# Patient Record
Sex: Female | Born: 1985
Health system: Southern US, Community
[De-identification: ages and names within clinical notes are randomized; demographics above are authoritative.]

## PROBLEM LIST (undated history)

## (undated) DIAGNOSIS — F419 Anxiety disorder, unspecified: Secondary | ICD-10-CM

## (undated) DIAGNOSIS — M419 Scoliosis, unspecified: Secondary | ICD-10-CM

## (undated) DIAGNOSIS — R55 Syncope and collapse: Secondary | ICD-10-CM

## (undated) DIAGNOSIS — F32A Depression, unspecified: Secondary | ICD-10-CM

## (undated) DIAGNOSIS — J449 Chronic obstructive pulmonary disease, unspecified: Secondary | ICD-10-CM

## (undated) DIAGNOSIS — F410 Panic disorder [episodic paroxysmal anxiety] without agoraphobia: Secondary | ICD-10-CM

## (undated) DIAGNOSIS — F329 Major depressive disorder, single episode, unspecified: Secondary | ICD-10-CM

## (undated) DIAGNOSIS — R51 Headache: Secondary | ICD-10-CM

## (undated) DIAGNOSIS — M1990 Unspecified osteoarthritis, unspecified site: Secondary | ICD-10-CM

## (undated) DIAGNOSIS — F319 Bipolar disorder, unspecified: Secondary | ICD-10-CM

## (undated) HISTORY — PX: TUBAL LIGATION: SHX77

## (undated) HISTORY — DX: Syncope and collapse: R55

## (undated) HISTORY — PX: FRACTURE SURGERY: SHX138

## (undated) HISTORY — DX: Headache: R51

---
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
## 2010-01-18 ENCOUNTER — Emergency Department (HOSPITAL_COMMUNITY): Admission: EM | Admit: 2010-01-18 | Discharge: 2010-01-19 | Payer: Self-pay | Admitting: Emergency Medicine

## 2010-06-08 LAB — URINALYSIS, ROUTINE W REFLEX MICROSCOPIC
Bilirubin Urine: NEGATIVE
Glucose, UA: NEGATIVE mg/dL
Hgb urine dipstick: NEGATIVE
Ketones, ur: NEGATIVE mg/dL
Nitrite: NEGATIVE
Protein, ur: NEGATIVE mg/dL
Specific Gravity, Urine: 1.008 (ref 1.005–1.030)
Urobilinogen, UA: 1 mg/dL (ref 0.0–1.0)
pH: 6.5 (ref 5.0–8.0)

## 2010-06-08 LAB — GC/CHLAMYDIA PROBE AMP, GENITAL
Chlamydia, DNA Probe: NEGATIVE
GC Probe Amp, Genital: NEGATIVE

## 2010-06-08 LAB — WET PREP, GENITAL
Clue Cells Wet Prep HPF POC: NONE SEEN
Trich, Wet Prep: NONE SEEN

## 2010-06-08 LAB — POCT PREGNANCY, URINE: Preg Test, Ur: NEGATIVE

## 2010-06-17 ENCOUNTER — Emergency Department (HOSPITAL_COMMUNITY): Payer: Self-pay

## 2010-06-17 ENCOUNTER — Emergency Department (HOSPITAL_COMMUNITY)
Admission: EM | Admit: 2010-06-17 | Discharge: 2010-06-17 | Disposition: A | Discharge status: Home / Self Care | Payer: Self-pay | Attending: Emergency Medicine | Admitting: Emergency Medicine

## 2010-06-17 DIAGNOSIS — M129 Arthropathy, unspecified: Secondary | ICD-10-CM | POA: Insufficient documentation

## 2010-06-17 DIAGNOSIS — R109 Unspecified abdominal pain: Secondary | ICD-10-CM | POA: Insufficient documentation

## 2010-06-17 DIAGNOSIS — R110 Nausea: Secondary | ICD-10-CM | POA: Insufficient documentation

## 2010-06-17 DIAGNOSIS — R072 Precordial pain: Secondary | ICD-10-CM | POA: Insufficient documentation

## 2010-06-17 LAB — DIFFERENTIAL
Eosinophils Absolute: 0.2 10*3/uL (ref 0.0–0.7)
Lymphocytes Relative: 34 % (ref 12–46)
Lymphs Abs: 3 10*3/uL (ref 0.7–4.0)
Neutro Abs: 5 10*3/uL (ref 1.7–7.7)
Neutrophils Relative %: 57 % (ref 43–77)

## 2010-06-17 LAB — URINE MICROSCOPIC-ADD ON

## 2010-06-17 LAB — URINALYSIS, ROUTINE W REFLEX MICROSCOPIC
Bilirubin Urine: NEGATIVE
Glucose, UA: NEGATIVE mg/dL
Hgb urine dipstick: NEGATIVE
Ketones, ur: NEGATIVE mg/dL
Protein, ur: NEGATIVE mg/dL
pH: 7.5 (ref 5.0–8.0)

## 2010-06-17 LAB — WET PREP, GENITAL: Yeast Wet Prep HPF POC: NONE SEEN

## 2010-06-17 LAB — COMPREHENSIVE METABOLIC PANEL
AST: 20 U/L (ref 0–37)
Albumin: 4.6 g/dL (ref 3.5–5.2)
Alkaline Phosphatase: 70 U/L (ref 39–117)
BUN: 8 mg/dL (ref 6–23)
CO2: 26 mEq/L (ref 19–32)
Chloride: 108 mEq/L (ref 96–112)
GFR calc non Af Amer: >60 mL/min (ref >60)
Potassium: 3.6 mEq/L (ref 3.5–5.1)
Total Bilirubin: 0.4 mg/dL (ref 0.3–1.2)

## 2010-06-17 LAB — POCT PREGNANCY, URINE: Preg Test, Ur: NEGATIVE

## 2010-06-17 LAB — CBC
Hemoglobin: 17.2 g/dL — ABNORMAL HIGH (ref 12.0–15.0)
MCV: 90.2 fL (ref 78.0–100.0)
Platelets: 269 10*3/uL (ref 150–400)
RBC: 5.33 MIL/uL — ABNORMAL HIGH (ref 3.87–5.11)
WBC: 8.7 10*3/uL (ref 4.0–10.5)

## 2010-06-18 LAB — GC/CHLAMYDIA PROBE AMP, GENITAL: GC Probe Amp, Genital: NEGATIVE
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
## 2010-08-20 ENCOUNTER — Emergency Department (HOSPITAL_COMMUNITY)
Admission: EM | Admit: 2010-08-20 | Discharge: 2010-08-20 | Disposition: A | Discharge status: Home / Self Care | Payer: Self-pay | Attending: Emergency Medicine | Admitting: Emergency Medicine

## 2010-08-20 DIAGNOSIS — Y92009 Unspecified place in unspecified non-institutional (private) residence as the place of occurrence of the external cause: Secondary | ICD-10-CM | POA: Insufficient documentation

## 2010-08-20 DIAGNOSIS — W260XXA Contact with knife, initial encounter: Secondary | ICD-10-CM | POA: Insufficient documentation

## 2010-08-20 DIAGNOSIS — S51809A Unspecified open wound of unspecified forearm, initial encounter: Secondary | ICD-10-CM | POA: Insufficient documentation

## 2010-08-20 DIAGNOSIS — Y93G3 Activity, cooking and baking: Secondary | ICD-10-CM | POA: Insufficient documentation

## 2010-08-30 ENCOUNTER — Inpatient Hospital Stay (INDEPENDENT_AMBULATORY_CARE_PROVIDER_SITE_OTHER)
Admission: RE | Admit: 2010-08-30 | Discharge: 2010-08-30 | Disposition: A | Discharge status: Home / Self Care | Payer: Self-pay | Source: Ambulatory Visit | Attending: Emergency Medicine | Admitting: Emergency Medicine

## 2010-08-30 DIAGNOSIS — Z0489 Encounter for examination and observation for other specified reasons: Secondary | ICD-10-CM

## 2011-02-12 ENCOUNTER — Emergency Department (HOSPITAL_COMMUNITY)
Admission: EM | Admit: 2011-02-12 | Discharge: 2011-02-13 | Disposition: A | Discharge status: Home / Self Care | Payer: Medicare Other | Attending: Emergency Medicine | Admitting: Emergency Medicine

## 2011-02-12 ENCOUNTER — Encounter: Payer: Self-pay | Admitting: *Deleted

## 2011-02-12 DIAGNOSIS — R42 Dizziness and giddiness: Secondary | ICD-10-CM | POA: Insufficient documentation

## 2011-02-12 DIAGNOSIS — N644 Mastodynia: Secondary | ICD-10-CM | POA: Insufficient documentation

## 2011-02-12 DIAGNOSIS — K5900 Constipation, unspecified: Secondary | ICD-10-CM | POA: Insufficient documentation

## 2011-02-12 DIAGNOSIS — R1031 Right lower quadrant pain: Secondary | ICD-10-CM | POA: Insufficient documentation

## 2011-02-12 DIAGNOSIS — R350 Frequency of micturition: Secondary | ICD-10-CM | POA: Insufficient documentation

## 2011-02-12 DIAGNOSIS — I498 Other specified cardiac arrhythmias: Secondary | ICD-10-CM | POA: Insufficient documentation

## 2011-02-12 DIAGNOSIS — E860 Dehydration: Secondary | ICD-10-CM

## 2011-02-12 HISTORY — DX: Anxiety disorder, unspecified: F41.9

## 2011-02-12 HISTORY — DX: Scoliosis, unspecified: M41.9

## 2011-02-12 HISTORY — DX: Panic disorder (episodic paroxysmal anxiety): F41.0

## 2011-02-12 HISTORY — DX: Unspecified osteoarthritis, unspecified site: M19.90

## 2011-02-12 NOTE — ED Notes (Addendum)
C/o dizziness, constipation, off balance, low R abd/pelvic pain, breasts hurting, increased urination, nausea. Dizziness on this am. C/c is dizziness. Screened for SI, denies SI/HI, but "may want to be seen for [facility] issues", lists h/o ETOH use to cope & h/o anti-depressants (distant), hopeful & hopelessness expressed, major life issues & stress reported.

## 2011-02-13 ENCOUNTER — Other Ambulatory Visit: Payer: Self-pay

## 2011-02-13 LAB — URINE MICROSCOPIC-ADD ON

## 2011-02-13 LAB — BASIC METABOLIC PANEL
CO2: 20 mEq/L (ref 19–32)
Chloride: 102 mEq/L (ref 96–112)
GFR calc non Af Amer: >90 mL/min (ref >90)
Glucose, Bld: 91 mg/dL (ref 70–99)
Potassium: 3.9 mEq/L (ref 3.5–5.1)
Sodium: 138 mEq/L (ref 135–145)

## 2011-02-13 LAB — PREGNANCY, URINE: Preg Test, Ur: NEGATIVE

## 2011-02-13 LAB — DIFFERENTIAL
Lymphocytes Relative: 23 % (ref 12–46)
Lymphs Abs: 2.4 10*3/uL (ref 0.7–4.0)
Neutrophils Relative %: 71 % (ref 43–77)

## 2011-02-13 LAB — CBC
Platelets: 253 10*3/uL (ref 150–400)
RBC: 5.42 MIL/uL — ABNORMAL HIGH (ref 3.87–5.11)
WBC: 10.6 10*3/uL — ABNORMAL HIGH (ref 4.0–10.5)

## 2011-02-13 LAB — URINALYSIS, ROUTINE W REFLEX MICROSCOPIC
Glucose, UA: NEGATIVE mg/dL
Hgb urine dipstick: NEGATIVE
Specific Gravity, Urine: 1.004 — ABNORMAL LOW (ref 1.005–1.030)
Urobilinogen, UA: 0.2 mg/dL (ref 0.0–1.0)

## 2011-02-13 MED ORDER — SODIUM CHLORIDE 0.9 % IV BOLUS (SEPSIS)
1000.0000 mL | Freq: Once | INTRAVENOUS | Status: AC
  Administered 2011-02-13: 1000 mL via INTRAVENOUS

## 2011-02-13 MED ORDER — MECLIZINE HCL 25 MG PO TABS
25.0000 mg | ORAL_TABLET | Freq: Once | ORAL | Status: AC
  Administered 2011-02-13: 25 mg via ORAL
  Filled 2011-02-13: qty 1

## 2011-02-13 MED ORDER — MECLIZINE HCL 50 MG PO TABS: 50.0000 mg | ORAL_TABLET | Freq: Three times a day (TID) | ORAL | Status: AC | PRN

## 2011-02-13 MED ORDER — DOCUSATE SODIUM 100 MG PO CAPS: 100.0000 mg | ORAL_CAPSULE | Freq: Two times a day (BID) | ORAL | Status: AC

## 2011-02-13 NOTE — ED Provider Notes (Signed)
Medical screening examination/treatment/procedure(s) were performed by non-physician practitioner and as supervising physician I was immediately available for consultation/collaboration.   Date: 02/13/2011  Rate: 109  Rhythm: sinus tachycardia  QRS Axis: normal  Intervals: normal  ST/T Wave abnormalities: nonspecific T wave changes  Conduction Disutrbances:none  Narrative Interpretation:   Old EKG Reviewed: none available    Celene Kras, MD 02/13/11 [phone]

## 2011-02-13 NOTE — ED Notes (Signed)
Pt c/o dizziness with movement. Pt also c/o increased urination. Pt denies pain at this time. No distress noted. Pt moving all extremities. No neuro deficits noted.

## 2011-02-13 NOTE — ED Notes (Signed)
Pt resting quietly in bed. No distress noted. IVF continue to infuse.

## 2011-02-14 LAB — URINE CULTURE: Colony Count: 9000

## 2011-09-04 ENCOUNTER — Encounter (HOSPITAL_COMMUNITY): Payer: Self-pay | Admitting: *Deleted

## 2011-09-04 ENCOUNTER — Emergency Department (HOSPITAL_COMMUNITY)
Admission: EM | Admit: 2011-09-04 | Discharge: 2011-09-05 | Disposition: A | Discharge status: Home / Self Care | Payer: Medicare Other | Attending: Emergency Medicine | Admitting: Emergency Medicine

## 2011-09-04 DIAGNOSIS — F411 Generalized anxiety disorder: Secondary | ICD-10-CM | POA: Insufficient documentation

## 2011-09-04 DIAGNOSIS — F17200 Nicotine dependence, unspecified, uncomplicated: Secondary | ICD-10-CM | POA: Diagnosis not present

## 2011-09-04 DIAGNOSIS — Z8739 Personal history of other diseases of the musculoskeletal system and connective tissue: Secondary | ICD-10-CM | POA: Diagnosis not present

## 2011-09-04 DIAGNOSIS — IMO0002 Reserved for concepts with insufficient information to code with codable children: Secondary | ICD-10-CM | POA: Diagnosis not present

## 2011-09-04 DIAGNOSIS — T7421XA Adult sexual abuse, confirmed, initial encounter: Secondary | ICD-10-CM | POA: Diagnosis not present
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
## 2011-09-05 NOTE — ED Provider Notes (Signed)
Medical screening examination/treatment/procedure(s) were performed by non-physician practitioner and as supervising physician I was immediately available for consultation/collaboration.   Tamira Ryland B. Bernette Mayers, MD 09/05/11 4098
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
## 2012-05-16 ENCOUNTER — Emergency Department (HOSPITAL_COMMUNITY)
Admission: EM | Admit: 2012-05-16 | Discharge: 2012-05-16 | Disposition: A | Discharge status: Home / Self Care | Payer: Medicare Other | Attending: Emergency Medicine | Admitting: Emergency Medicine

## 2012-05-16 ENCOUNTER — Encounter (HOSPITAL_COMMUNITY): Payer: Self-pay

## 2012-05-16 DIAGNOSIS — Z79899 Other long term (current) drug therapy: Secondary | ICD-10-CM | POA: Insufficient documentation

## 2012-05-16 DIAGNOSIS — Z8739 Personal history of other diseases of the musculoskeletal system and connective tissue: Secondary | ICD-10-CM | POA: Insufficient documentation

## 2012-05-16 DIAGNOSIS — K089 Disorder of teeth and supporting structures, unspecified: Secondary | ICD-10-CM | POA: Insufficient documentation

## 2012-05-16 DIAGNOSIS — H9209 Otalgia, unspecified ear: Secondary | ICD-10-CM | POA: Insufficient documentation

## 2012-05-16 DIAGNOSIS — K029 Dental caries, unspecified: Secondary | ICD-10-CM | POA: Insufficient documentation

## 2012-05-16 DIAGNOSIS — Z8659 Personal history of other mental and behavioral disorders: Secondary | ICD-10-CM | POA: Diagnosis not present

## 2012-05-16 DIAGNOSIS — F17200 Nicotine dependence, unspecified, uncomplicated: Secondary | ICD-10-CM | POA: Insufficient documentation

## 2012-05-16 HISTORY — DX: Major depressive disorder, single episode, unspecified: F32.9

## 2012-05-16 HISTORY — DX: Depression, unspecified: F32.A

## 2012-05-16 MED ORDER — HYDROCODONE-ACETAMINOPHEN 5-325 MG PO TABS: 1.0000 | ORAL_TABLET | ORAL | Status: DC | PRN

## 2012-05-16 MED ORDER — HYDROCODONE-ACETAMINOPHEN 5-325 MG PO TABS
2.0000 | ORAL_TABLET | Freq: Once | ORAL | Status: AC
  Administered 2012-05-16: 2 via ORAL
  Filled 2012-05-16: qty 2

## 2012-05-16 MED ORDER — PENICILLIN V POTASSIUM 500 MG PO TABS: 500.0000 mg | ORAL_TABLET | Freq: Three times a day (TID) | ORAL | Status: DC

## 2012-05-16 NOTE — ED Provider Notes (Signed)
Medical screening examination/treatment/procedure(s) were performed by non-physician practitioner and as supervising physician I was immediately available for consultation/collaboration.   Larrissa Stivers B. Bernette Mayers, MD 05/16/12 2253

## 2012-05-16 NOTE — ED Notes (Signed)
Pt c/o right lower dental pain and right ear pain. States "it hurts so bad and I can't sleep." No swelling noted to face.

## 2012-05-16 NOTE — ED Provider Notes (Signed)
History     CSN: [id number]  Arrival date & time 05/16/12  2213   None     Chief Complaint  Patient presents with  . Dental Pain   HPI  History provided by the patient. Patient is a 27 year old female who presents with complaints of right lower dental pains and right ear pain. Patient states symptoms began yesterday and have been constant and worsening. She has been taking BC powders and Orajel without any improvements. Pain is described as severe. She does report some recent history of indigestion but denies rhinorrhea or cough. She denies any associated fever, chills or sweats. Denies any other aggravating or alleviating factors. Denies any other associated symptoms.    Past Medical History  Diagnosis Date  . Scoliosis   . Arthritis   . Anxiety   . Panic   . Depression     Past Surgical History  Procedure Laterality Date  . Fracture surgery    . Tubal ligation      Family History  Problem Relation Age of Onset  . Adopted: Yes  . Cancer Other   . Diabetes Other     History  Substance Use Topics  . Smoking status: Current Every Day Smoker -- 1.00 packs/day  . Smokeless tobacco: Not on file  . Alcohol Use: Yes     Comment: every few days    OB History   Grav Para Term Preterm Abortions TAB SAB Ect Mult Living                  Review of Systems  Constitutional: Positive for chills. Negative for fever.  HENT: Positive for ear pain, congestion and dental problem. Negative for hearing loss.   Respiratory: Negative for cough.   Gastrointestinal: Negative for nausea and vomiting.  All other systems reviewed and are negative.    Allergies  Other  Home Medications   Current Outpatient Rx  Name  Route  Sig  Dispense  Refill  . Aspirin-Salicylamide-Caffeine (BC HEADACHE POWDER PO)   Oral   Take 1 packet by mouth every 6 (six) hours as needed (for pain).         . benzocaine (ORAJEL) 10 % mucosal gel   Mouth/Throat   Use as directed 1 application in  the mouth or throat every 2 (two) hours as needed for pain (for dental pain).         [name] HYDROcodone-acetaminophen (NORCO) 5-325 MG per tablet   Oral   Take 1 tablet by mouth every 4 (four) hours as needed for pain.   20 tablet   0   . penicillin v potassium (VEETID) 500 MG tablet   Oral   Take 1 tablet (500 mg total) by mouth 3 (three) times daily.   30 tablet   0     BP 130/87  Pulse 89  Temp(Src) 98 F (36.7 C) (Oral)  Resp 18  SpO2 98%  LMP 05/15/2012  Physical Exam  Nursing note and vitals reviewed. Constitutional: She is oriented to person, place, and time. She appears well-developed and well-nourished. No distress.  HENT:  Head: Normocephalic.  Right Ear: Tympanic membrane normal.  Left Ear: Tympanic membrane normal.  Mouth/Throat: Oropharynx is clear and moist.  Poor dentition throughout with multiple dental caries. There is no swelling of the gums or under the tongue. Patient has some tenderness along the right lower canine and premolar teeth. No ulcers or lesions to the gums or mouth.  Neck: Normal range of motion.  Neck supple.  Cardiovascular: Normal rate and regular rhythm.   Pulmonary/Chest: Effort normal and breath sounds normal.  Musculoskeletal: Normal range of motion.  Lymphadenopathy:    She has no cervical adenopathy.  Neurological: She is alert and oriented to person, place, and time.  Skin: Skin is warm and dry. No rash noted.  Psychiatric: She has a normal mood and affect. Her behavior is normal.    ED Course  Procedures      1. Pain, dental   2. Dental caries       MDM  10:40 PM patient seen and evaluated. Patient appears uncomfortable but in no acute distress.        Angus Seller, [place] 05/16/12 [phone]

## 2012-09-11 DIAGNOSIS — M2550 Pain in unspecified joint: Secondary | ICD-10-CM | POA: Diagnosis not present

## 2012-09-11 DIAGNOSIS — F411 Generalized anxiety disorder: Secondary | ICD-10-CM | POA: Diagnosis not present

## 2012-09-11 DIAGNOSIS — F39 Unspecified mood [affective] disorder: Secondary | ICD-10-CM | POA: Diagnosis not present

## 2012-09-11 DIAGNOSIS — F329 Major depressive disorder, single episode, unspecified: Secondary | ICD-10-CM | POA: Diagnosis not present

## 2012-09-11 DIAGNOSIS — IMO0001 Reserved for inherently not codable concepts without codable children: Secondary | ICD-10-CM | POA: Diagnosis not present

## 2012-09-11 DIAGNOSIS — M549 Dorsalgia, unspecified: Secondary | ICD-10-CM | POA: Diagnosis not present

## 2012-10-22 ENCOUNTER — Telehealth (HOSPITAL_COMMUNITY): Payer: Self-pay

## 2012-10-22 NOTE — Telephone Encounter (Signed)
11:35am 10/22/12 Spoke with patient informed that her pcp [facility]) sent over a referral for appt with a psychiatrist - pt stated that she like that she didn't need to see anyone.  The pcp was call and informed.Marland KitchenMarguerite Olea

## 2012-11-04 DIAGNOSIS — F17200 Nicotine dependence, unspecified, uncomplicated: Secondary | ICD-10-CM | POA: Diagnosis not present

## 2012-11-04 DIAGNOSIS — IMO0001 Reserved for inherently not codable concepts without codable children: Secondary | ICD-10-CM | POA: Diagnosis not present

## 2012-11-04 DIAGNOSIS — G4700 Insomnia, unspecified: Secondary | ICD-10-CM | POA: Diagnosis not present

## 2012-11-04 DIAGNOSIS — F329 Major depressive disorder, single episode, unspecified: Secondary | ICD-10-CM | POA: Diagnosis not present

## 2012-11-27 DIAGNOSIS — F17200 Nicotine dependence, unspecified, uncomplicated: Secondary | ICD-10-CM | POA: Diagnosis not present

## 2012-11-27 DIAGNOSIS — N926 Irregular menstruation, unspecified: Secondary | ICD-10-CM | POA: Diagnosis not present

## 2012-11-27 DIAGNOSIS — R9389 Abnormal findings on diagnostic imaging of other specified body structures: Secondary | ICD-10-CM | POA: Diagnosis not present

## 2012-11-27 DIAGNOSIS — R51 Headache: Secondary | ICD-10-CM | POA: Diagnosis not present

## 2013-01-14 ENCOUNTER — Ambulatory Visit (HOSPITAL_COMMUNITY): Payer: Medicare Other | Admitting: Physician Assistant

## 2013-01-20 ENCOUNTER — Encounter (INDEPENDENT_AMBULATORY_CARE_PROVIDER_SITE_OTHER): Payer: Self-pay

## 2013-01-20 ENCOUNTER — Encounter (HOSPITAL_COMMUNITY): Payer: Self-pay | Admitting: Psychiatry

## 2013-01-20 ENCOUNTER — Ambulatory Visit (INDEPENDENT_AMBULATORY_CARE_PROVIDER_SITE_OTHER): Payer: Medicare Other | Admitting: Psychiatry

## 2013-01-20 VITALS — BP 106/69 | HR 86 | Ht 63.5 in | Wt 139.0 lb

## 2013-01-20 DIAGNOSIS — F319 Bipolar disorder, unspecified: Secondary | ICD-10-CM

## 2013-01-20 MED ORDER — TRAZODONE HCL 50 MG PO TABS: ORAL_TABLET | ORAL | Status: DC

## 2013-01-20 MED ORDER — LAMOTRIGINE 25 MG PO TABS: ORAL_TABLET | ORAL | Status: DC
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
## 2013-02-03 ENCOUNTER — Encounter (HOSPITAL_COMMUNITY): Payer: Self-pay | Admitting: Psychiatry

## 2013-02-03 ENCOUNTER — Ambulatory Visit (INDEPENDENT_AMBULATORY_CARE_PROVIDER_SITE_OTHER): Payer: Medicare Other | Admitting: Psychiatry

## 2013-02-03 VITALS — BP 103/68 | HR 90 | Ht 63.5 in | Wt 138.2 lb

## 2013-02-03 DIAGNOSIS — F319 Bipolar disorder, unspecified: Secondary | ICD-10-CM

## 2013-02-03 MED ORDER — ARIPIPRAZOLE 2 MG PO TABS: 10.0000 mg | ORAL_TABLET | Freq: Every day | ORAL | Status: DC

## 2013-02-03 MED ORDER — GABAPENTIN 100 MG PO CAPS: 100.0000 mg | ORAL_CAPSULE | Freq: Three times a day (TID) | ORAL | Status: DC
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
## 2013-02-06 ENCOUNTER — Ambulatory Visit (INDEPENDENT_AMBULATORY_CARE_PROVIDER_SITE_OTHER): Payer: Medicare Other | Admitting: Psychiatry

## 2013-02-06 ENCOUNTER — Encounter (HOSPITAL_COMMUNITY): Payer: Self-pay | Admitting: Psychiatry

## 2013-02-06 DIAGNOSIS — F3160 Bipolar disorder, current episode mixed, unspecified: Secondary | ICD-10-CM | POA: Diagnosis not present

## 2013-02-06 DIAGNOSIS — F319 Bipolar disorder, unspecified: Secondary | ICD-10-CM
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
## 2013-02-26 ENCOUNTER — Ambulatory Visit (HOSPITAL_COMMUNITY): Payer: Self-pay | Admitting: Psychiatry

## 2013-03-04 ENCOUNTER — Encounter (HOSPITAL_COMMUNITY): Payer: Self-pay | Admitting: Psychiatry

## 2013-03-04 ENCOUNTER — Ambulatory Visit (INDEPENDENT_AMBULATORY_CARE_PROVIDER_SITE_OTHER): Payer: Medicare Other | Admitting: Psychiatry

## 2013-03-04 VITALS — BP 105/73 | HR 82 | Ht 63.5 in | Wt 140.0 lb

## 2013-03-04 DIAGNOSIS — F319 Bipolar disorder, unspecified: Secondary | ICD-10-CM | POA: Diagnosis not present

## 2013-03-04 MED ORDER — CLONAZEPAM 0.5 MG PO TABS: 0.5000 mg | ORAL_TABLET | Freq: Every day | ORAL | Status: DC

## 2013-03-04 MED ORDER — LITHIUM CARBONATE 300 MG PO TABS: 300.0000 mg | ORAL_TABLET | Freq: Two times a day (BID) | ORAL | Status: DC
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
## 2013-03-10 ENCOUNTER — Telehealth (HOSPITAL_COMMUNITY): Payer: Self-pay

## 2013-03-10 ENCOUNTER — Telehealth (HOSPITAL_COMMUNITY): Payer: Self-pay | Admitting: Psychiatry

## 2013-03-10 ENCOUNTER — Telehealth (HOSPITAL_COMMUNITY): Payer: Self-pay | Admitting: *Deleted

## 2013-03-10 DIAGNOSIS — F319 Bipolar disorder, unspecified: Secondary | ICD-10-CM

## 2013-03-10 MED ORDER — CLONAZEPAM 0.5 MG PO TABS: 0.5000 mg | ORAL_TABLET | Freq: Two times a day (BID) | ORAL | Status: DC
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
## 2013-03-10 NOTE — Telephone Encounter (Signed)
I returned patient's phone call.  She had lithium and Klonopin.  However she requires both medication twice a day to calm herself.  She is requesting a new prescription to be called in.

## 2013-03-10 NOTE — Telephone Encounter (Signed)
Attempted call back [phone] no answer.

## 2013-03-24 ENCOUNTER — Telehealth (HOSPITAL_COMMUNITY): Payer: Self-pay | Admitting: *Deleted

## 2013-03-24 NOTE — Telephone Encounter (Signed)
See contact ntes

## 2013-04-01 ENCOUNTER — Ambulatory Visit (INDEPENDENT_AMBULATORY_CARE_PROVIDER_SITE_OTHER): Payer: Medicare Other | Admitting: Psychiatry

## 2013-04-01 ENCOUNTER — Encounter (HOSPITAL_COMMUNITY): Payer: Self-pay | Admitting: Psychiatry

## 2013-04-01 VITALS — BP 105/77 | HR 88 | Ht 63.5 in | Wt 138.8 lb

## 2013-04-01 DIAGNOSIS — F319 Bipolar disorder, unspecified: Secondary | ICD-10-CM | POA: Diagnosis not present

## 2013-04-01 MED ORDER — HYDROXYZINE PAMOATE 25 MG PO CAPS: ORAL_CAPSULE | ORAL | Status: DC

## 2013-04-01 MED ORDER — CLONAZEPAM 0.5 MG PO TABS: 0.5000 mg | ORAL_TABLET | Freq: Two times a day (BID) | ORAL | Status: DC

## 2013-04-01 MED ORDER — LITHIUM CARBONATE 300 MG PO TABS: 300.0000 mg | ORAL_TABLET | Freq: Two times a day (BID) | ORAL | Status: DC
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
## 2013-04-22 ENCOUNTER — Ambulatory Visit (HOSPITAL_COMMUNITY): Payer: Self-pay | Admitting: Psychiatry

## 2013-04-23 ENCOUNTER — Telehealth (HOSPITAL_COMMUNITY): Payer: Self-pay | Admitting: Psychiatry

## 2013-04-23 DIAGNOSIS — F319 Bipolar disorder, unspecified: Secondary | ICD-10-CM

## 2013-04-23 MED ORDER — HYDROXYZINE PAMOATE 50 MG PO CAPS: ORAL_CAPSULE | ORAL | Status: DC

## 2013-04-23 NOTE — Telephone Encounter (Signed)
I returned patient's phone call.  She did not respond to Vistaril 25 mg.  She tried to [facility] and wondering if she can take up to 75-100 mg at bedtime.  Patient does not have any side effects.  I would call a new prescription of Vistaril 50 mg 1-2 capsules at bedtime.  I recommend to call us back if she does not see any improvement.

## 2013-04-28 ENCOUNTER — Telehealth (HOSPITAL_COMMUNITY): Payer: Self-pay

## 2013-05-15 ENCOUNTER — Telehealth (HOSPITAL_COMMUNITY): Payer: Self-pay

## 2013-05-15 NOTE — Telephone Encounter (Signed)
Patient is concerned about her missing period.  She is wondering about lithium can cause her periods stopped.  I recommended to have a pregnancy test and until then she should not take any lithium.  I recommended to call us back about her test results.

## 2013-05-29 ENCOUNTER — Ambulatory Visit (INDEPENDENT_AMBULATORY_CARE_PROVIDER_SITE_OTHER): Payer: Medicare Other | Admitting: Psychiatry

## 2013-05-29 ENCOUNTER — Encounter (HOSPITAL_COMMUNITY): Payer: Self-pay | Admitting: Psychiatry

## 2013-05-29 DIAGNOSIS — F319 Bipolar disorder, unspecified: Secondary | ICD-10-CM

## 2013-05-29 MED ORDER — LITHIUM CARBONATE 300 MG PO TABS: 300.0000 mg | ORAL_TABLET | Freq: Two times a day (BID) | ORAL | Status: DC

## 2013-05-29 MED ORDER — CLONAZEPAM 0.5 MG PO TABS: 0.5000 mg | ORAL_TABLET | Freq: Two times a day (BID) | ORAL | Status: DC
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
## 2013-06-05 ENCOUNTER — Telehealth (HOSPITAL_COMMUNITY): Payer: Self-pay

## 2013-06-06 NOTE — Telephone Encounter (Signed)
I called patient's cell number and left a message.  I also tried at her home but no one picked up the phone.

## 2013-06-26 ENCOUNTER — Telehealth (HOSPITAL_COMMUNITY): Payer: Self-pay | Admitting: *Deleted

## 2013-06-26 NOTE — Telephone Encounter (Signed)
  Patient left VM:On Lithium and Klonopin.Wants to know if they can be increased since she is anxious  Informed patient that she needs to stay on same dose until next appt 4/16, that MD will want to see her in person to evaluate her to make changes in either medication

## 2013-06-27 DIAGNOSIS — H16229 Keratoconjunctivitis sicca, not specified as Sjogren's, unspecified eye: Secondary | ICD-10-CM | POA: Diagnosis not present

## 2013-06-27 DIAGNOSIS — G44209 Tension-type headache, unspecified, not intractable: Secondary | ICD-10-CM | POA: Diagnosis not present

## 2013-07-10 ENCOUNTER — Ambulatory Visit (INDEPENDENT_AMBULATORY_CARE_PROVIDER_SITE_OTHER): Payer: Medicare Other | Admitting: Psychiatry

## 2013-07-10 VITALS — BP 121/73 | HR 89 | Ht 63.0 in | Wt 143.2 lb

## 2013-07-10 DIAGNOSIS — F319 Bipolar disorder, unspecified: Secondary | ICD-10-CM | POA: Diagnosis not present

## 2013-07-10 MED ORDER — LITHIUM CARBONATE ER 450 MG PO TBCR: 450.0000 mg | EXTENDED_RELEASE_TABLET | Freq: Two times a day (BID) | ORAL | Status: DC

## 2013-07-10 MED ORDER — CLONAZEPAM 0.5 MG PO TABS: 0.5000 mg | ORAL_TABLET | Freq: Three times a day (TID) | ORAL | Status: DC | PRN

## 2013-07-10 MED ORDER — PAROXETINE HCL 10 MG PO TABS: ORAL_TABLET | ORAL | Status: DC

## 2013-07-11 ENCOUNTER — Encounter (HOSPITAL_COMMUNITY): Payer: Self-pay | Admitting: Psychiatry
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
## 2013-07-25 DIAGNOSIS — H1045 Other chronic allergic conjunctivitis: Secondary | ICD-10-CM | POA: Diagnosis not present

## 2013-07-28 ENCOUNTER — Telehealth (HOSPITAL_COMMUNITY): Payer: Self-pay | Admitting: *Deleted

## 2013-07-28 ENCOUNTER — Other Ambulatory Visit (HOSPITAL_COMMUNITY): Payer: Self-pay | Admitting: Psychiatry

## 2013-07-28 ENCOUNTER — Telehealth (HOSPITAL_COMMUNITY): Payer: Self-pay

## 2013-07-28 NOTE — Telephone Encounter (Signed)
Patient left VM:MD started her on new antidepressant 2 weeks ago.Now she is having bad side effects.Requests call from MD  Phoned patient: New antidepressant made her sick.Dizzy, shaky, paranoid,and felt nauseated.Very light headed.Sensitive to light-especially sunlight.Helped depression, but couldn't stand it.She took last dose Saturday.

## 2013-07-28 NOTE — Telephone Encounter (Signed)
Call returned.  Recommended to stop Paxil since patient is complaining of side effects.  We would discuss more options on her next appointment.

## 2013-08-01 DIAGNOSIS — H5210 Myopia, unspecified eye: Secondary | ICD-10-CM | POA: Diagnosis not present

## 2013-08-01 DIAGNOSIS — H16229 Keratoconjunctivitis sicca, not specified as Sjogren's, unspecified eye: Secondary | ICD-10-CM | POA: Diagnosis not present

## 2013-08-07 ENCOUNTER — Ambulatory Visit (HOSPITAL_COMMUNITY): Payer: Self-pay | Admitting: Psychiatry

## 2013-08-19 ENCOUNTER — Ambulatory Visit (HOSPITAL_COMMUNITY): Payer: Self-pay | Admitting: Marriage and Family Therapist

## 2013-08-20 ENCOUNTER — Other Ambulatory Visit (HOSPITAL_COMMUNITY): Payer: Self-pay | Admitting: Psychiatry

## 2013-08-20 ENCOUNTER — Encounter (HOSPITAL_COMMUNITY): Payer: Self-pay | Admitting: Psychiatry

## 2013-08-20 ENCOUNTER — Ambulatory Visit (INDEPENDENT_AMBULATORY_CARE_PROVIDER_SITE_OTHER): Payer: Medicare Other | Admitting: Psychiatry

## 2013-08-20 VITALS — BP 118/78 | HR 84 | Ht 63.0 in | Wt 138.2 lb

## 2013-08-20 DIAGNOSIS — F319 Bipolar disorder, unspecified: Secondary | ICD-10-CM | POA: Diagnosis not present

## 2013-08-20 DIAGNOSIS — Z79899 Other long term (current) drug therapy: Secondary | ICD-10-CM

## 2013-08-20 LAB — CBC WITH DIFFERENTIAL/PLATELET
Basophils Absolute: 0 10*3/uL (ref 0.0–0.1)
Basophils Relative: 0 % (ref 0–1)
EOS ABS: 0.1 10*3/uL (ref 0.0–0.7)
EOS PCT: 1 % (ref 0–5)
HCT: 42.4 % (ref 36.0–46.0)
HEMOGLOBIN: 14.8 g/dL (ref 12.0–15.0)
LYMPHS ABS: 2.3 10*3/uL (ref 0.7–4.0)
LYMPHS PCT: 26 % (ref 12–46)
MCH: 30.5 pg (ref 26.0–34.0)
MCHC: 34.9 g/dL (ref 30.0–36.0)
MCV: 87.2 fL (ref 78.0–100.0)
MONOS PCT: 6 % (ref 3–12)
Monocytes Absolute: 0.5 10*3/uL (ref 0.1–1.0)
Neutro Abs: 6 10*3/uL (ref 1.7–7.7)
Neutrophils Relative %: 67 % (ref 43–77)
Platelets: 291 10*3/uL (ref 150–400)
RBC: 4.86 MIL/uL (ref 3.87–5.11)
RDW: 13.4 % (ref 11.5–15.5)
WBC: 9 10*3/uL (ref 4.0–10.5)

## 2013-08-20 MED ORDER — LITHIUM CARBONATE ER 450 MG PO TBCR: 450.0000 mg | EXTENDED_RELEASE_TABLET | Freq: Two times a day (BID) | ORAL | Status: DC

## 2013-08-20 MED ORDER — OLANZAPINE 5 MG PO TABS: 5.0000 mg | ORAL_TABLET | Freq: Every day | ORAL | Status: DC

## 2013-08-20 MED ORDER — CLONAZEPAM 0.5 MG PO TABS: 0.5000 mg | ORAL_TABLET | Freq: Two times a day (BID) | ORAL | Status: DC
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
## 2013-08-21 LAB — COMPREHENSIVE METABOLIC PANEL
ALT: 13 U/L (ref 0–35)
AST: 14 U/L (ref 0–37)
Albumin: 4.4 g/dL (ref 3.5–5.2)
Alkaline Phosphatase: 81 U/L (ref 39–117)
BILIRUBIN TOTAL: 0.6 mg/dL (ref 0.2–1.2)
BUN: 8 mg/dL (ref 6–23)
CALCIUM: 9.3 mg/dL (ref 8.4–10.5)
CHLORIDE: 105 meq/L (ref 96–112)
CO2: 23 meq/L (ref 19–32)
CREATININE: 0.61 mg/dL (ref 0.50–1.10)
GLUCOSE: 70 mg/dL (ref 70–99)
Potassium: 3.8 mEq/L (ref 3.5–5.3)
SODIUM: 139 meq/L (ref 135–145)
TOTAL PROTEIN: 6.9 g/dL (ref 6.0–8.3)

## 2013-08-21 LAB — DRUGS OF ABUSE SCREEN W/O ALC, ROUTINE URINE
AMPHETAMINE SCRN UR: NEGATIVE
BARBITURATE QUANT UR: NEGATIVE
Benzodiazepines.: POSITIVE — AB
Cocaine Metabolites: NEGATIVE
Creatinine,U: 398.4 mg/dL
MARIJUANA METABOLITE: NEGATIVE
METHADONE: NEGATIVE
OPIATE SCREEN, URINE: NEGATIVE
Phencyclidine (PCP): NEGATIVE
Propoxyphene: NEGATIVE

## 2013-08-21 LAB — LITHIUM LEVEL: Lithium Lvl: 0.5 mEq/L — ABNORMAL LOW (ref 0.80–1.40)

## 2013-08-21 LAB — HEMOGLOBIN A1C
Hgb A1c MFr Bld: 5.3 % (ref <5.7)
Mean Plasma Glucose: 105 mg/dL (ref <117)

## 2013-08-23 LAB — BENZODIAZEPINES (GC/LC/MS), URINE
ALPRAZOLAMU: NEGATIVE ng/mL (ref <25)
Clonazepam metabolite (GC/LC/MS), ur confirm: NEGATIVE ng/mL (ref <25)
FLURAZEPAMU: NEGATIVE ng/mL (ref <50)
Lorazepam (GC/LC/MS), ur confirm: NEGATIVE ng/mL (ref <50)
MIDAZOLAMU: NEGATIVE ng/mL (ref <50)
Nordiazepam (GC/LC/MS), ur confirm: NEGATIVE ng/mL (ref <50)
Oxazepam (GC/LC/MS), ur confirm: NEGATIVE ng/mL (ref <50)
TEMAZEPAMU: NEGATIVE ng/mL (ref <50)
Triazolam metabolite (GC/LC/MS), ur confirm: NEGATIVE ng/mL (ref <50)

## 2013-09-09 ENCOUNTER — Telehealth (HOSPITAL_COMMUNITY): Payer: Self-pay

## 2013-09-17 ENCOUNTER — Encounter (HOSPITAL_COMMUNITY): Payer: Self-pay

## 2013-09-17 ENCOUNTER — Ambulatory Visit (HOSPITAL_COMMUNITY): Payer: Self-pay | Admitting: Psychiatry

## 2013-09-29 ENCOUNTER — Ambulatory Visit (HOSPITAL_COMMUNITY): Payer: Self-pay | Admitting: Psychology

## 2013-10-01 ENCOUNTER — Other Ambulatory Visit (HOSPITAL_COMMUNITY): Payer: Self-pay | Admitting: Psychiatry

## 2013-10-01 ENCOUNTER — Telehealth (HOSPITAL_COMMUNITY): Payer: Self-pay

## 2013-10-01 DIAGNOSIS — F319 Bipolar disorder, unspecified: Secondary | ICD-10-CM

## 2013-10-01 MED ORDER — CLONAZEPAM 0.5 MG PO TABS: 0.5000 mg | ORAL_TABLET | Freq: Two times a day (BID) | ORAL | Status: DC

## 2013-10-01 NOTE — Telephone Encounter (Signed)
We have called prescription today. Please check your messages.

## 2013-10-02 ENCOUNTER — Ambulatory Visit (HOSPITAL_COMMUNITY): Payer: Self-pay | Admitting: Psychiatry

## 2013-10-06 ENCOUNTER — Ambulatory Visit (INDEPENDENT_AMBULATORY_CARE_PROVIDER_SITE_OTHER): Payer: Medicare Other | Admitting: Psychiatry

## 2013-10-06 ENCOUNTER — Encounter (HOSPITAL_COMMUNITY): Payer: Self-pay | Admitting: Psychiatry

## 2013-10-06 VITALS — BP 102/67 | HR 92 | Ht 63.0 in | Wt 150.2 lb

## 2013-10-06 DIAGNOSIS — F319 Bipolar disorder, unspecified: Secondary | ICD-10-CM

## 2013-10-06 MED ORDER — MIRTAZAPINE 15 MG PO TABS: 15.0000 mg | ORAL_TABLET | Freq: Every day | ORAL | Status: DC

## 2013-10-06 MED ORDER — LITHIUM CARBONATE ER 450 MG PO TBCR: 450.0000 mg | EXTENDED_RELEASE_TABLET | Freq: Two times a day (BID) | ORAL | Status: DC
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
## 2013-10-13 ENCOUNTER — Telehealth (HOSPITAL_COMMUNITY): Payer: Self-pay

## 2013-10-13 NOTE — Telephone Encounter (Signed)
Patient is complaining of increased headache with the Remeron and she does not feel comfortable taking it.  She stopped the medicine last night.  I suggested not to start again and continue lithium and Klonopin as prescribed.  We will discussed our next appointment about other possibilities.

## 2013-10-13 NOTE — Telephone Encounter (Signed)
Patient left VM:VM:Was prescribed new Anti-depressant at last appt.Was having bad side effects and stopped it last night.

## 2013-11-10 ENCOUNTER — Ambulatory Visit (INDEPENDENT_AMBULATORY_CARE_PROVIDER_SITE_OTHER): Payer: Medicare Other | Admitting: Psychiatry

## 2013-11-10 ENCOUNTER — Encounter (HOSPITAL_COMMUNITY): Payer: Self-pay | Admitting: Psychiatry

## 2013-11-10 VITALS — BP 103/72 | HR 78 | Ht 64.0 in | Wt 145.2 lb

## 2013-11-10 DIAGNOSIS — F319 Bipolar disorder, unspecified: Secondary | ICD-10-CM | POA: Diagnosis not present

## 2013-11-10 MED ORDER — LITHIUM CARBONATE ER 450 MG PO TBCR: 450.0000 mg | EXTENDED_RELEASE_TABLET | Freq: Two times a day (BID) | ORAL | Status: DC

## 2013-11-10 MED ORDER — CLONAZEPAM 0.5 MG PO TABS: ORAL_TABLET | ORAL | Status: DC
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
## 2014-01-12 ENCOUNTER — Telehealth (HOSPITAL_COMMUNITY): Payer: Self-pay

## 2014-01-12 ENCOUNTER — Encounter (HOSPITAL_COMMUNITY): Payer: Self-pay | Admitting: Psychiatry

## 2014-01-12 ENCOUNTER — Ambulatory Visit (INDEPENDENT_AMBULATORY_CARE_PROVIDER_SITE_OTHER): Payer: Medicare Other | Admitting: Psychiatry

## 2014-01-12 VITALS — BP 105/78 | HR 95 | Ht 64.0 in | Wt 141.0 lb

## 2014-01-12 DIAGNOSIS — F319 Bipolar disorder, unspecified: Secondary | ICD-10-CM

## 2014-01-12 MED ORDER — CLONAZEPAM 0.5 MG PO TABS: ORAL_TABLET | ORAL | Status: DC

## 2014-01-12 MED ORDER — LITHIUM CARBONATE ER 450 MG PO TBCR: 450.0000 mg | EXTENDED_RELEASE_TABLET | Freq: Two times a day (BID) | ORAL | Status: DC

## 2014-01-12 MED ORDER — HYDROXYZINE PAMOATE 25 MG PO CAPS: ORAL_CAPSULE | ORAL | Status: DC

## 2014-01-12 NOTE — Telephone Encounter (Signed)
I return phone call.  Explained that if she tried any other medication than is the chances of weight gain.  Patient accepted and she will try Vistaril when she get paid on November 3.
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
## 2014-01-14 ENCOUNTER — Other Ambulatory Visit (HOSPITAL_COMMUNITY): Payer: Self-pay | Admitting: *Deleted

## 2014-01-14 ENCOUNTER — Other Ambulatory Visit (HOSPITAL_COMMUNITY): Payer: Self-pay | Admitting: Psychiatry

## 2014-01-14 DIAGNOSIS — F319 Bipolar disorder, unspecified: Secondary | ICD-10-CM

## 2014-01-14 MED ORDER — CLONAZEPAM 0.5 MG PO TABS: ORAL_TABLET | ORAL | Status: DC

## 2014-01-14 NOTE — Telephone Encounter (Signed)
Received fax from pharmacy, requested clarification of Klonopin RX given 10/19. Contacted [facility] @ [phone] toclarify RX: Take 1 tablet twice a day and 3rd if needed. Spoke with [place]

## 2014-02-12 ENCOUNTER — Emergency Department (HOSPITAL_COMMUNITY)
Admission: EM | Admit: 2014-02-12 | Discharge: 2014-02-12 | Disposition: A | Discharge status: Home / Self Care | Payer: Medicare Other | Attending: Emergency Medicine | Admitting: Emergency Medicine

## 2014-02-12 ENCOUNTER — Encounter (HOSPITAL_COMMUNITY): Payer: Self-pay | Admitting: Emergency Medicine

## 2014-02-12 DIAGNOSIS — Z79899 Other long term (current) drug therapy: Secondary | ICD-10-CM | POA: Diagnosis not present

## 2014-02-12 DIAGNOSIS — Z720 Tobacco use: Secondary | ICD-10-CM | POA: Insufficient documentation

## 2014-02-12 DIAGNOSIS — M67432 Ganglion, left wrist: Secondary | ICD-10-CM | POA: Insufficient documentation

## 2014-02-12 DIAGNOSIS — F419 Anxiety disorder, unspecified: Secondary | ICD-10-CM | POA: Insufficient documentation

## 2014-02-12 DIAGNOSIS — M791 Myalgia, unspecified site: Secondary | ICD-10-CM

## 2014-02-12 DIAGNOSIS — F329 Major depressive disorder, single episode, unspecified: Secondary | ICD-10-CM | POA: Diagnosis not present

## 2014-02-12 DIAGNOSIS — M79642 Pain in left hand: Secondary | ICD-10-CM | POA: Diagnosis present

## 2014-02-12 DIAGNOSIS — M419 Scoliosis, unspecified: Secondary | ICD-10-CM | POA: Diagnosis not present

## 2014-02-12 DIAGNOSIS — Z3202 Encounter for pregnancy test, result negative: Secondary | ICD-10-CM | POA: Insufficient documentation

## 2014-02-12 LAB — URINALYSIS, ROUTINE W REFLEX MICROSCOPIC
Bilirubin Urine: NEGATIVE
GLUCOSE, UA: NEGATIVE mg/dL
HGB URINE DIPSTICK: NEGATIVE
Ketones, ur: NEGATIVE mg/dL
Leukocytes, UA: NEGATIVE
Nitrite: NEGATIVE
PROTEIN: NEGATIVE mg/dL
Specific Gravity, Urine: 1.012 (ref 1.005–1.030)
UROBILINOGEN UA: 1 mg/dL (ref 0.0–1.0)
pH: 7.5 (ref 5.0–8.0)

## 2014-02-12 LAB — CBC WITH DIFFERENTIAL/PLATELET
BASOS PCT: 0 % (ref 0–1)
Basophils Absolute: 0 10*3/uL (ref 0.0–0.1)
EOS PCT: 1 % (ref 0–5)
Eosinophils Absolute: 0.1 10*3/uL (ref 0.0–0.7)
HCT: 42.5 % (ref 36.0–46.0)
Hemoglobin: 14.5 g/dL (ref 12.0–15.0)
LYMPHS ABS: 2.4 10*3/uL (ref 0.7–4.0)
Lymphocytes Relative: 27 % (ref 12–46)
MCH: 30.5 pg (ref 26.0–34.0)
MCHC: 34.1 g/dL (ref 30.0–36.0)
MCV: 89.5 fL (ref 78.0–100.0)
Monocytes Absolute: 0.5 10*3/uL (ref 0.1–1.0)
Monocytes Relative: 6 % (ref 3–12)
NEUTROS PCT: 66 % (ref 43–77)
Neutro Abs: 6 10*3/uL (ref 1.7–7.7)
PLATELETS: 269 10*3/uL (ref 150–400)
RBC: 4.75 MIL/uL (ref 3.87–5.11)
RDW: 12.7 % (ref 11.5–15.5)
WBC: 9 10*3/uL (ref 4.0–10.5)

## 2014-02-12 LAB — BASIC METABOLIC PANEL
Anion gap: 11 (ref 5–15)
BUN: 6 mg/dL (ref 6–23)
CALCIUM: 9.7 mg/dL (ref 8.4–10.5)
CO2: 24 mEq/L (ref 19–32)
Chloride: 105 mEq/L (ref 96–112)
Creatinine, Ser: 0.64 mg/dL (ref 0.50–1.10)
GFR calc non Af Amer: >90 mL/min (ref >90)
GLUCOSE: 87 mg/dL (ref 70–99)
Potassium: 3.7 mEq/L (ref 3.7–5.3)
SODIUM: 140 meq/L (ref 137–147)

## 2014-02-12 LAB — PREGNANCY, URINE: Preg Test, Ur: NEGATIVE

## 2014-02-12 MED ORDER — IBUPROFEN 800 MG PO TABS: 800.0000 mg | ORAL_TABLET | Freq: Three times a day (TID) | ORAL | Status: DC

## 2014-02-12 NOTE — ED Notes (Signed)
Attempted to collect blood sample twice without success, will request that alternate staff obtain blood.

## 2014-02-12 NOTE — Discharge Instructions (Signed)
Muscle Pain  Muscle pain (myalgia) may be caused by many things, including:   Overuse or muscle strain, especially if you are not in shape. This is the most common cause of muscle pain.   Injury.   Bruises.   Viruses, such as the flu.   Infectious diseases.   Fibromyalgia, which is a chronic condition that causes muscle tenderness, fatigue, and headache.   Autoimmune diseases, including lupus.   Certain drugs, including ACE inhibitors and statins.  Muscle pain may be mild or severe. In most cases, the pain lasts only a short time and goes away without treatment. To diagnose the cause of your muscle pain, your health care provider will take your medical history. This means he or she will ask you when your muscle pain began and what has been happening. If you have not had muscle pain for very long, your health care provider may want to wait before doing much testing. If your muscle pain has lasted a long time, your health care provider may want to run tests right away. If your health care provider thinks your muscle pain may be caused by illness, you may need to have additional tests to rule out certain conditions.   Treatment for muscle pain depends on the cause. Home care is often enough to relieve muscle pain. Your health care provider may also prescribe anti-inflammatory medicine.  HOME CARE INSTRUCTIONS  Watch your condition for any changes. The following actions may help to lessen any discomfort you are feeling:   Only take over-the-counter or prescription medicines as directed by your health care provider.   Apply ice to the sore muscle:   Put ice in a plastic bag.   Place a towel between your skin and the bag.   Leave the ice on for 15-20 minutes, 3-4 times a day.   You may alternate applying hot and cold packs to the muscle as directed by your health care provider.   If overuse is causing your muscle pain, slow down your activities until the pain goes away.   Remember that it is normal to feel  some muscle pain after starting a workout program. Muscles that have not been used often will be sore at first.   Do regular, gentle exercises if you are not usually active.   Warm up before exercising to lower your risk of muscle pain.   Do not continue working out if the pain is very bad. Bad pain could mean you have injured a muscle.  SEEK MEDICAL CARE IF:   Your muscle pain gets worse, and medicines do not help.   You have muscle pain that lasts longer than 3 days.   You have a rash or fever along with muscle pain.   You have muscle pain after a tick bite.   You have muscle pain while working out, even though you are in good physical condition.   You have redness, soreness, or swelling along with muscle pain.   You have muscle pain after starting a new medicine or changing the dose of a medicine.  SEEK IMMEDIATE MEDICAL CARE IF:   You have trouble breathing.   You have trouble swallowing.   You have muscle pain along with a stiff neck, fever, and vomiting.   You have severe muscle weakness or cannot move part of your body.  MAKE SURE YOU:    Understand these instructions.   Will watch your condition.   Will get help right away if you are not   questions you have with your health care provider. Ganglion Cyst A ganglion cyst is a noncancerous, fluid-filled lump that occurs near joints or tendons. The ganglion cyst grows out of a joint or the lining of a tendon. It most often develops in the hand or wrist but can also develop in the shoulder, elbow, hip, knee, ankle, or foot. The round or oval ganglion can be pea sized or larger than a grape. Increased activity may enlarge the size of the cyst because more fluid  starts to build up.  CAUSES  It is not completely known what causes a ganglion cyst to grow. However, it may be related to:  Inflammation or irritation around the joint.  An injury.  Repetitive movements or overuse.  Arthritis. SYMPTOMS  A lump most often appears in the hand or wrist, but can occur in other areas of the body. Generally, the lump is painless without other symptoms. However, sometimes pain can be felt during activity or when pressure is applied to the lump. The lump may even be tender to the touch. Tingling, pain, numbness, or muscle weakness can occur if the ganglion cyst presses on a nerve. Your grip may be weak and you may have less movement in your joints.  DIAGNOSIS  Ganglion cysts are most often diagnosed based on a physical exam, noting where the cyst is and how it looks. Your caregiver will feel the lump and may shine a light alongside it. If it is a ganglion, a light often shines through it. Your caregiver may order an X-ray, ultrasound, or MRI to rule out other conditions. TREATMENT  Ganglions usually go away on their own without treatment. If pain or other symptoms are involved, treatment may be needed. Treatment is also needed if the ganglion limits your movement or if it gets infected. Treatment options include:  Wearing a wrist or finger brace or splint.  Taking anti-inflammatory medicine.  Draining fluid from the lump with a needle (aspiration).  Injecting a steroid into the joint.  Surgery to remove the ganglion cyst and its stalk that is attached to the joint or tendon. However, ganglion cysts can grow back. HOME CARE INSTRUCTIONS   Do not press on the ganglion, poke it with a needle, or hit it with a heavy object. You may rub the lump gently and often. Sometimes fluid moves out of the cyst.  Only take medicines as directed by your caregiver.  Wear your brace or splint as directed by your caregiver. SEEK MEDICAL CARE IF:   Your ganglion becomes  larger or more painful.  You have increased redness, red streaks, or swelling.  You have pus coming from the lump.  You have weakness or numbness in the affected area. MAKE SURE YOU:   Understand these instructions.  Will watch your condition.  Will get help right away if you are not doing well or get worse. Document Released: 03/10/2000 Document Revised: 12/06/2011 Document Reviewed: 05/07/2007 [facility] Patient Information 2015 [place]. This information is not intended to replace advice given to you by your health care provider. Make sure you discuss any questions you have with your health care provider.  Emergency Department Resource Guide 1) Find a Doctor and Pay Out of Pocket Although you won't have to find out who is covered by your insurance plan, it is a good idea to ask around and get recommendations. You will then need to call the office and see if the doctor you have chosen will accept you as a new  patient and what types of options they offer for patients who are self-pay. Some doctors offer discounts or will set up payment plans for their patients who do not have insurance, but you will need to ask so you aren't surprised when you get to your appointment.  2) Contact Your Local Health Department Not all health departments have doctors that can see patients for sick visits, but many do, so it is worth a call to see if yours does. If you don't know where your local health department is, you can check in your phone book. The CDC also has a tool to help you locate your state's health department, and many state websites also have listings of all of their local health departments.  3) Find a Walk-in Clinic If your illness is not likely to be very severe or complicated, you may want to try a walk in clinic. These are popping up all over the country in pharmacies, drugstores, and shopping centers. They're usually staffed by nurse practitioners or physician assistants that have  been trained to treat common illnesses and complaints. They're usually fairly quick and inexpensive. However, if you have serious medical issues or chronic medical problems, these are probably not your best option.  No Primary Care Doctor: - Call Health Connect at  [phone] - they can help you locate a primary care doctor that  accepts your insurance, provides certain services, etc. - Physician Referral Service- [phone]  Chronic Pain Problems: Organization         Address  Phone   Notes  [facility]  [phone] Patients need to be referred by their primary care doctor.   Medication Assistance: Organization         Address  Phone   Notes  [facility] [address] [place], [place] [address] [phone] --Must be a resident of [facility] -- Must have NO insurance coverage whatsoever (no Medicaid/ Medicare, etc.) -- The pt. MUST have a primary care doctor that directs their care regularly and follows them in the community   MedAssist  [phone]   [employer]  [phone]    Agencies that provide inexpensive medical care: Organization         Address  Phone   Notes  [facility]  [phone]   [facility]    [phone]   [facility] [address] [place], [place] [address] [phone]   [facility] [address], [place] [phone]   [facility]anned Parenthood    [phone]   [facility]    [phone]   [facility]  [address], [place] Phone:  [phone], Fax:  [phone] Hours of Operation:  9 am - 6 pm, M-F.  Also accepts Medicaid/Medicare and self-pay.  [facility]  [address], [place] Phone: [phone], Fax: [phone]. Hours of Operation:  8:30 am - 5:30 pm, M-F.  Also accepts Medicaid and  self-pay.  [facility] [place] [address], [place] Phone: [phone]   [facility] [address], [place], [place] [phone], Ext. [phone] Mondays & Thursdays: 7-9 AM.  First 15 patients are seen on a first come, first serve basis.    Medicaid-accepting [facility] Providers:  Organization         Address  Phone   Notes  [facility] [address], [place] [phone] Also accepts self-pay patients.  [facility] [id number] [place], [place]  [phone]   [place], [address], [place] [phone]   [facility] [address] [place] [phone]   Lucianne Lei [address], [place]   [phone] Only accepts [place] Access [place] patients after they have their name applied to their card.   Self-Pay (no insurance) in [facility] [place]:  Organization         Address  Phone   Notes  Sickle Cell Patients, [facility] [place] [phone]   [facility] [place] [phone]   [facility] [place]  [place], [place], [place] [phone]   [facility]/Dr. Osei-Bonsu  [address], [place] or [address], [place] [phone] Phone number for both [place] and [place] locations is the same.  [facility] [address], [place] [phone]   [facility] [address], [place] or [address] [phone]   [facility] [address] [place] [phone], phone; [phone], fax Sees patients 1st and 3rd Saturday of every month.  Must not qualify for public or private insurance (i.e. Medicaid, Medicare, [place] Health Choice, Veterans' Benefits)  Household income  should be no more than 200% of the poverty level The clinic cannot treat you if you are pregnant or think you are pregnant  Sexually transmitted diseases are not treated at the clinic.    Dental Care: Organization         Address  Phone  Notes  [facility] [place] [phone] Accepts children up to age 55 who are enrolled in [place] or [place]; pregnant women with a Medicaid card; and children who have applied for Medicaid or [place] Health Choice, but were declined, whose parents can pay a reduced fee at time of service.  [facility] Department of [facility]  [address], [place] [phone] Accepts children up to age 68 who are enrolled in [place] or [place]; pregnant women with a Medicaid card; and children who have applied for Medicaid or [place] Health Choice, but were declined, whose parents can pay a reduced fee at time of service.  [place] Adult [facility] Access PRO[facility]  [facility] [phone] Patients are seen by appointment only. Walk-ins are not accepted. [place] will see patients 75 years of age and older. Monday - Tuesday (8am-5pm) Most Wednesdays (8:30-5pm) $30 per visit, cash only  [facility] Adult Dental Access PROGRAM  [address], [facility] [phone] Patients are seen by appointment only. Walk-ins are not accepted. [place] will see patients 35 years of age and older. One Wednesday Evening (Monthly: Volunteer Based).  $30 per visit, cash only  [place]  [phone] for adults; Children under age 58, call Graduate Pediatric Dentistry at [phone]. Children aged 64-14, please call [phone] to request a pediatric application.  Dental services are provided in all areas of dental care including fillings, crowns and  bridges, complete and partial dentures, implants, gum treatment,  root canals, and extractions. Preventive care is also provided. Treatment is provided to both adults and children. Patients are selected via a lottery and there is often a waiting list.   [facility] [address] [place]  [phone] [email or web address]   [facility] [address] [place], [place] [phone], Ext. [phone] Second and Fourth Thursday of each month, opens at 6:30 AM; Clinic ends at 9 AM.  Patients are seen on a first-come first-served basis, and a limited number are seen during each clinic.   [facility]  [address] Hillard Danker [place], [place] [phone]   Eligibility Requirements You must have lived in [place], [place], or [place] counties for at least the last three months.   You cannot be eligible for state or federal sponsored [employer], including [employer], [place], or [employer].   You generally cannot be eligible for healthcare insurance through your employer.    How to apply: Eligibility screenings are held every Tuesday and Wednesday afternoon from 1:00 pm until 4:00 pm. You do not need an appointment for the interview!  [facility] [address] [place], [place]   [place]  [place]edom [place]  [place]  [phone]    Behavioral Health Resources in the Community: Intensive Outpatient Programs Organization         Address  Phone  Notes  [place]. [address], [place], [place] [phone]   [facility] [address] [place], [place]   [facility]: Alcohol [facility] Drug S[facility]cs [address] [place], [place]   [place] [address],  [place], [place] or [phone]   Substance Abuse Resources Organization         Address  Phone  Notes  Alcohol and Drug Services   [phone]   [place]  [phone]   [facility]   Chinita Pester  [phone]   Residential & Outpatient Substance Abuse Program  [phone]   Psychological Services Organization         Address  Phone  Notes  [facility] [place]  [place]  [phone]   [place] [address], [place] or [phone]    Mobile Crisis Teams Organization         Address  Phone  Notes  [facility], Mobile Crisis Care Unit  [phone]   [facility]  [address] [place], [place]   [place] Levels [address], [place] [phone]    Self-Help/Support Groups Organization         Address  Phone             Notes  [place]. of [place] - variety of support groups  [place] Call for more information  Narcotics Anonymous (NA), [facility] [address], [employer] [place]  2 meetings at this location   Special educational needs teacher         Address  Phone  Notes  [facility] [place],    [place]  [place]  [address] [place], [place], [place]   [place], [place] [phone]  Admissions: 8am-3pm M-F  Incentives Substance [place] [address],    [place], [place] [phone]   [facility] [address] [place], [place], [place]   [facility] [address],  [place], [place]   Insight Programs - Intensive Outpatient [address] Krist[address], [place], [place]   [facility] [place].,  [place], [place] [phone] or [phone]   [facility] ([facility]S) [address] [place], [place] Accepts Medicaid  [facility] [place] [address],  [place] [phone] Substance Abuse/Addiction Treatment   [facility] Organization         Address  Phone  Notes  [facility]  [phone]   Domenic Schwab, PhD [address] Arlis Porta [place], [place]   [phone] or [phone]   [place], [place] [phone]   [facility] [address], [place], [place] [phone] Insurance/Medicaid/sponsorship through [facility] and Families [address]                                    [place], [place] [phone] [place] [address], [place] [phone]    Dr. Adele Schilder  [phone]   [facility] Dept. 1) [address],  2) [place] 3)  [place] [address], [place] [phone]  [phone]   [place] [phone] or [phone] (After Hours)

## 2014-02-12 NOTE — ED Notes (Signed)
Per pt, states she has left hand pain, burning nipples, weak and tired-not able to sleep-

## 2014-02-12 NOTE — ED Provider Notes (Signed)
CSN: [id number]     Arrival date & time 02/12/14  1731 History   First MD Initiated Contact with Patient 02/12/14 1848     Chief Complaint  Patient presents with  . Hand Pain  . burning nipples   . lethargic      (Consider location/radiation/quality/duration/timing/severity/associated sxs/prior Treatment) Patient is a 28 y.o. female presenting with musculoskeletal pain. The history is provided by the patient. No language interpreter was used.  Muscle Pain This is a chronic problem. The current episode started more than 1 month ago. The problem occurs constantly. The problem has been gradually worsening. Associated symptoms include joint swelling and myalgias. Nothing aggravates the symptoms. She has tried nothing for the symptoms. The treatment provided moderate relief.  Pt reports she ws diagnosed years ago with RA. No current treatment.  Pt is not on any medications  Past Medical History  Diagnosis Date  . Scoliosis   . Arthritis   . Anxiety   . Panic   . Depression    Past Surgical History  Procedure Laterality Date  . Fracture surgery    . Tubal ligation     Family History  Problem Relation Age of Onset  . Adopted: Yes  . Cancer Other   . Diabetes Other    History  Substance Use Topics  . Smoking status: Current Every Day Smoker -- 1.00 packs/day  . Smokeless tobacco: Not on file  . Alcohol Use: Yes     Comment: every few days   OB History    No data available     Review of Systems  Musculoskeletal: Positive for myalgias and joint swelling.  All other systems reviewed and are negative.     Allergies  Other  Home Medications   Prior to Admission medications   Medication Sig Start Date End Date Taking? Authorizing Provider  clonazePAM (KLONOPIN) 0.5 MG tablet Take i tab twice a day and 3rd as needed Patient taking differently: Take 0.5 mg by mouth at bedtime as needed for anxiety (and sleep).  01/14/14  Yes Cleotis NipperSyed T Arfeen, MD  lithium carbonate  (ESKALITH) 450 MG CR tablet Take 1 tablet (450 mg total) by mouth 2 (two) times daily. Patient taking differently: Take 450 mg by mouth daily.  01/12/14  Yes Cleotis NipperSyed T Arfeen, MD  hydrOXYzine (VISTARIL) 25 MG capsule Take 1-2 capsule at bed time Patient not taking: Reported on 02/12/2014 01/12/14   Cleotis NipperSyed T Arfeen, MD   BP 108/75 mmHg  Pulse 88  Temp(Src) 98.7 F (37.1 C) (Oral)  Resp 20  SpO2 100%  LMP 01/10/2014 (Approximate) Physical Exam  Constitutional: She is oriented to person, place, and time. She appears well-developed and well-nourished.  HENT:  Head: Normocephalic.  Right Ear: External ear normal.  Eyes: EOM are normal. Pupils are equal, round, and reactive to light.  Neck: Normal range of motion.  Cardiovascular: Normal rate, regular rhythm and normal heart sounds.   Pulmonary/Chest: Effort normal and breath sounds normal.  Abdominal: She exhibits no distension.  Musculoskeletal: Normal range of motion.  1cm cyst left wrist   Neurological: She is alert and oriented to person, place, and time.  Skin: Skin is warm.  Psychiatric: She has a normal mood and affect.  Nursing note and vitals reviewed.   ED Course  Procedures (including critical care time) Labs Review Labs Reviewed  URINALYSIS, ROUTINE W REFLEX MICROSCOPIC - Abnormal; Notable for the following:    APPearance CLOUDY (*)    All other components within  normal limits  CBC WITH DIFFERENTIAL  BASIC METABOLIC PANEL  POC URINE PREG, ED    Imaging Review No results found.   EKG Interpretation None      Results for orders placed or performed during the hospital encounter of 02/12/14  CBC WITH DIFFERENTIAL  Result Value Ref Range   WBC 9.0 4.0 - 10.5 K/uL   RBC 4.75 3.87 - 5.11 MIL/uL   Hemoglobin 14.5 12.0 - 15.0 g/dL   HCT 40.942.5 81.136.0 - 91.446.0 %   MCV 89.5 78.0 - 100.0 fL   MCH 30.5 26.0 - 34.0 pg   MCHC 34.1 30.0 - 36.0 g/dL   RDW 78.212.7 95.611.5 - 21.315.5 %   Platelets 269 150 - 400 K/uL   Neutrophils Relative  % 66 43 - 77 %   Neutro Abs 6.0 1.7 - 7.7 K/uL   Lymphocytes Relative 27 12 - 46 %   Lymphs Abs 2.4 0.7 - 4.0 K/uL   Monocytes Relative 6 3 - 12 %   Monocytes Absolute 0.5 0.1 - 1.0 K/uL   Eosinophils Relative 1 0 - 5 %   Eosinophils Absolute 0.1 0.0 - 0.7 K/uL   Basophils Relative 0 0 - 1 %   Basophils Absolute 0.0 0.0 - 0.1 K/uL  Basic metabolic panel  Result Value Ref Range   Sodium 140 137 - 147 mEq/L   Potassium 3.7 3.7 - 5.3 mEq/L   Chloride 105 96 - 112 mEq/L   CO2 24 19 - 32 mEq/L   Glucose, Bld 87 70 - 99 mg/dL   BUN 6 6 - 23 mg/dL   Creatinine, Ser 0.860.64 0.50 - 1.10 mg/dL   Calcium 9.7 8.4 - 57.810.5 mg/dL   GFR calc non Af Amer >90 >90 mL/min   GFR calc Af Amer >90 >90 mL/min   Anion gap 11 5 - 15  Urinalysis with microscopic  Result Value Ref Range   Color, Urine YELLOW YELLOW   APPearance CLOUDY (A) CLEAR   Specific Gravity, Urine 1.012 1.005 - 1.030   pH 7.5 5.0 - 8.0   Glucose, UA NEGATIVE NEGATIVE mg/dL   Hgb urine dipstick NEGATIVE NEGATIVE   Bilirubin Urine NEGATIVE NEGATIVE   Ketones, ur NEGATIVE NEGATIVE mg/dL   Protein, ur NEGATIVE NEGATIVE mg/dL   Urobilinogen, UA 1.0 0.0 - 1.0 mg/dL   Nitrite NEGATIVE NEGATIVE   Leukocytes, UA NEGATIVE NEGATIVE  Pregnancy, urine  Result Value Ref Range   Preg Test, Ur NEGATIVE NEGATIVE   No results found.   MDM Pt's records reviewed. No bnote of rheumatoid arthritis.     Final diagnoses:  Ganglion cyst of wrist, left  Muscle ache   . Resource guide referral Dr. Merlyn LotKuzma referral for wrist cyst Pt advised she needs primary care evaluation and doctor.  AVS   Elson AreasLeslie K Jenella Craigie, PA-C 02/12/14 2353  Nelia Shiobert L Beaton, MD 02/18/14 2137

## 2014-03-02 ENCOUNTER — Ambulatory Visit (INDEPENDENT_AMBULATORY_CARE_PROVIDER_SITE_OTHER): Payer: Medicare Other | Admitting: Psychiatry

## 2014-03-02 ENCOUNTER — Encounter (HOSPITAL_COMMUNITY): Payer: Self-pay | Admitting: Psychiatry

## 2014-03-02 VITALS — BP 98/66 | HR 75 | Ht 64.0 in | Wt 136.4 lb

## 2014-03-02 DIAGNOSIS — F319 Bipolar disorder, unspecified: Secondary | ICD-10-CM

## 2014-03-02 MED ORDER — LITHIUM CARBONATE ER 450 MG PO TBCR: 450.0000 mg | EXTENDED_RELEASE_TABLET | Freq: Two times a day (BID) | ORAL | Status: DC

## 2014-03-02 MED ORDER — CLONAZEPAM 0.5 MG PO TABS: ORAL_TABLET | ORAL | Status: DC
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
## 2014-04-12 ENCOUNTER — Other Ambulatory Visit (HOSPITAL_COMMUNITY): Payer: Self-pay | Admitting: Psychiatry

## 2014-05-04 ENCOUNTER — Ambulatory Visit (INDEPENDENT_AMBULATORY_CARE_PROVIDER_SITE_OTHER): Payer: Medicare Other | Admitting: Psychiatry

## 2014-05-04 ENCOUNTER — Encounter (HOSPITAL_COMMUNITY): Payer: Self-pay | Admitting: Psychiatry

## 2014-05-04 VITALS — BP 120/82 | HR 85 | Ht 64.0 in | Wt 136.4 lb

## 2014-05-04 DIAGNOSIS — F319 Bipolar disorder, unspecified: Secondary | ICD-10-CM | POA: Diagnosis not present

## 2014-05-04 MED ORDER — CLONAZEPAM 0.5 MG PO TABS: ORAL_TABLET | ORAL | Status: DC

## 2014-05-04 MED ORDER — LITHIUM CARBONATE ER 300 MG PO TBCR: 300.0000 mg | EXTENDED_RELEASE_TABLET | Freq: Two times a day (BID) | ORAL | Status: DC
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
## 2014-06-18 ENCOUNTER — Telehealth (HOSPITAL_COMMUNITY): Payer: Self-pay

## 2014-06-18 NOTE — Telephone Encounter (Signed)
Called patient to advise her of Dr. Sheela StackArfeen's answer.  Dr. Lolly MustacheArfeen stated he will not do an early refill.  Patient stated that she will just take Nyquil until her visit with Dr. Lolly MustacheArfeen because she has to sleep.

## 2014-06-18 NOTE — Telephone Encounter (Signed)
Medication management - patient reports has been taking Klonopin 0.5mg  1 in the morning and 2 qhs instead of 1.  Has 1 pill left and requests new order with increase due to not sleeping without 2 at night.  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  Requests increase and call back today.

## 2014-07-06 ENCOUNTER — Ambulatory Visit (HOSPITAL_COMMUNITY): Payer: Self-pay | Admitting: Psychiatry

## 2014-07-20 ENCOUNTER — Encounter (HOSPITAL_COMMUNITY): Payer: Self-pay | Admitting: Psychiatry

## 2014-07-20 ENCOUNTER — Ambulatory Visit (INDEPENDENT_AMBULATORY_CARE_PROVIDER_SITE_OTHER): Payer: Medicare Other | Admitting: Psychiatry

## 2014-07-20 VITALS — BP 106/73 | HR 97 | Ht 64.0 in | Wt 130.4 lb

## 2014-07-20 DIAGNOSIS — F319 Bipolar disorder, unspecified: Secondary | ICD-10-CM

## 2014-07-20 MED ORDER — CLONAZEPAM 0.5 MG PO TABS: ORAL_TABLET | ORAL | Status: DC

## 2014-07-20 MED ORDER — LITHIUM CARBONATE ER 300 MG PO TBCR: 300.0000 mg | EXTENDED_RELEASE_TABLET | Freq: Two times a day (BID) | ORAL | Status: DC
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
## 2014-08-26 ENCOUNTER — Telehealth (HOSPITAL_COMMUNITY): Payer: Self-pay | Admitting: *Deleted

## 2014-08-26 NOTE — Telephone Encounter (Signed)
Patient left message on nurses vm stating she just moved back to [place] from [place].  Patient stated that her RX for Klonopin is on hold and they will not transfer [facility] to [facility] [place] on [address]  Patient request new RX for Klonopin be given.  I called [facility] at [address] Per [facility] at [facility] [place], [place] on 08-17-14.  I called [facility] [place] at [phone] and spoke with Crystal.  Per Crystal patient picked up RX for Klonopin on 08-17-14.   I called patient and informed of what [facility] stated and refill request is denied.

## 2014-08-28 ENCOUNTER — Other Ambulatory Visit (HOSPITAL_COMMUNITY): Payer: Self-pay | Admitting: Psychiatry

## 2014-08-31 NOTE — Telephone Encounter (Signed)
Klonopin refill request declined at this time as patient was given a new printed prescription on 07/20/14 plus one refill so is too early to refill.

## 2014-09-21 ENCOUNTER — Ambulatory Visit (HOSPITAL_COMMUNITY): Payer: Self-pay | Admitting: Psychiatry

## 2014-09-30 ENCOUNTER — Ambulatory Visit (HOSPITAL_COMMUNITY): Payer: Self-pay | Admitting: Psychiatry

## 2014-10-02 ENCOUNTER — Other Ambulatory Visit (HOSPITAL_COMMUNITY): Payer: Self-pay

## 2014-10-02 DIAGNOSIS — F319 Bipolar disorder, unspecified: Secondary | ICD-10-CM

## 2014-10-02 MED ORDER — CLONAZEPAM 0.5 MG PO TABS: ORAL_TABLET | ORAL | Status: DC

## 2014-10-02 NOTE — Telephone Encounter (Signed)
Met with Dr. Salem Senate who authorized a one time refill of patient's prescribed Klonopin 0.5 mg, one in the morning and one at bedtime as needed, #45 with no refills.  Called in order with Barbaraann Rondo, pharmacist at patient's [facility] on [address]. Called patient back to inform one time order sent to her [place] and will see patient on 11/03/14.

## 2014-10-02 NOTE — Telephone Encounter (Signed)
Telephone call from patient requesting a refill of her Klonopin be called into [facility] [address].  States she was rescheduled from 09/30/14 to now return on 11/03/14 and is in need of a refill.  Patient stated she still was fine on Lithium at this time but did need a Klonopin refill, last ordered for #45 plus one refill on 07/20/14 with instructions to take 1 in the AM and one at bedtime prn.

## 2014-10-20 ENCOUNTER — Telehealth (HOSPITAL_COMMUNITY): Payer: Self-pay

## 2014-10-20 ENCOUNTER — Other Ambulatory Visit (HOSPITAL_COMMUNITY): Payer: Self-pay | Admitting: Psychiatry

## 2014-10-20 DIAGNOSIS — F319 Bipolar disorder, unspecified: Secondary | ICD-10-CM

## 2014-10-20 MED ORDER — LITHIUM CARBONATE ER 300 MG PO TBCR: 300.0000 mg | EXTENDED_RELEASE_TABLET | Freq: Two times a day (BID) | ORAL | Status: DC

## 2014-10-20 NOTE — Telephone Encounter (Signed)
Telephone message received from patient requesting a refill of her prescribed Lithium.  Patient last seen 07/20/14 and was rescheduled from 09/21/14 and 09/30/14 to 11/03/14 due to provider being out on those dates.

## 2014-10-21 NOTE — Telephone Encounter (Signed)
Telephone call to inform her Dr. Lolly Mustache had e-scribed in her Lithium refill to her pharmacy as patient reported not feeling as well lately with some symptoms of dehydration and diarrhea a few times.  Discussed possible lithium toxicity symptoms as patient states she has been sweating a lot and does not like water.  Discussed other ways to stay hydrated and to make sure taking in some sodium replacement due to this could be increasing her level for lithium.  Patient agrees to do this and will keep appointment for 10/23/14 to come in and discuss.  Denies any ataxia or other symptoms at this time but will call back if some are experienced.  Agreed to inform Dr. Lolly Mustache to see if he advised any other changes until appointment.

## 2014-10-23 ENCOUNTER — Ambulatory Visit (INDEPENDENT_AMBULATORY_CARE_PROVIDER_SITE_OTHER): Payer: Medicare Other | Admitting: Psychiatry

## 2014-10-23 ENCOUNTER — Encounter (HOSPITAL_COMMUNITY): Payer: Self-pay | Admitting: Psychiatry

## 2014-10-23 VITALS — BP 104/77 | HR 85 | Ht 64.0 in | Wt 134.2 lb

## 2014-10-23 DIAGNOSIS — F319 Bipolar disorder, unspecified: Secondary | ICD-10-CM

## 2014-10-23 DIAGNOSIS — Z79899 Other long term (current) drug therapy: Secondary | ICD-10-CM | POA: Diagnosis not present

## 2014-10-23 LAB — CBC WITH DIFFERENTIAL/PLATELET
BASOS ABS: 0.1 10*3/uL (ref 0.0–0.1)
Basophils Relative: 1 % (ref 0–1)
EOS PCT: 2 % (ref 0–5)
Eosinophils Absolute: 0.2 10*3/uL (ref 0.0–0.7)
HCT: 41.1 % (ref 36.0–46.0)
Hemoglobin: 14.1 g/dL (ref 12.0–15.0)
Lymphocytes Relative: 30 % (ref 12–46)
Lymphs Abs: 2.5 10*3/uL (ref 0.7–4.0)
MCH: 31.1 pg (ref 26.0–34.0)
MCHC: 34.3 g/dL (ref 30.0–36.0)
MCV: 90.5 fL (ref 78.0–100.0)
MONOS PCT: 4 % (ref 3–12)
MPV: 8.9 fL (ref 8.6–12.4)
Monocytes Absolute: 0.3 10*3/uL (ref 0.1–1.0)
Neutro Abs: 5.3 10*3/uL (ref 1.7–7.7)
Neutrophils Relative %: 63 % (ref 43–77)
Platelets: 284 10*3/uL (ref 150–400)
RBC: 4.54 MIL/uL (ref 3.87–5.11)
RDW: 13.3 % (ref 11.5–15.5)
WBC: 8.4 10*3/uL (ref 4.0–10.5)

## 2014-10-23 LAB — HEMOGLOBIN A1C
Hgb A1c MFr Bld: 5 % (ref <5.7)
Mean Plasma Glucose: 97 mg/dL (ref <117)

## 2014-10-23 MED ORDER — FLUOXETINE HCL 10 MG PO CAPS
10.0000 mg | ORAL_CAPSULE | Freq: Every day | ORAL | Status: DC
Start: 2014-10-23 — End: 2014-11-24

## 2014-10-23 MED ORDER — CLONAZEPAM 0.5 MG PO TABS: ORAL_TABLET | ORAL | Status: DC

## 2014-10-23 MED ORDER — LITHIUM CARBONATE ER 300 MG PO TBCR: 300.0000 mg | EXTENDED_RELEASE_TABLET | Freq: Two times a day (BID) | ORAL | Status: DC
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
## 2014-10-24 LAB — COMPLETE METABOLIC PANEL WITH GFR
ALK PHOS: 73 U/L (ref 33–115)
ALT: 12 U/L (ref 6–29)
AST: 17 U/L (ref 10–30)
Albumin: 4.3 g/dL (ref 3.6–5.1)
BILIRUBIN TOTAL: 0.4 mg/dL (ref 0.2–1.2)
BUN: 8 mg/dL (ref 7–25)
CHLORIDE: 104 mmol/L (ref 98–110)
CO2: 21 mmol/L (ref 20–31)
Calcium: 9 mg/dL (ref 8.6–10.2)
Creat: 0.58 mg/dL (ref 0.50–1.10)
GFR, Est African American: >89 mL/min (ref >=60)
Glucose, Bld: 89 mg/dL (ref 65–99)
POTASSIUM: 3.7 mmol/L (ref 3.5–5.3)
Sodium: 139 mmol/L (ref 135–146)
TOTAL PROTEIN: 6.9 g/dL (ref 6.1–8.1)

## 2014-10-24 LAB — TSH: TSH: 3.051 u[IU]/mL (ref 0.350–4.500)

## 2014-10-24 LAB — LITHIUM LEVEL: LITHIUM LVL: 0.4 meq/L — AB (ref 0.80–1.40)

## 2014-11-03 ENCOUNTER — Ambulatory Visit (HOSPITAL_COMMUNITY): Payer: Self-pay | Admitting: Psychiatry

## 2014-11-09 ENCOUNTER — Telehealth (HOSPITAL_COMMUNITY): Payer: Self-pay

## 2014-11-10 ENCOUNTER — Other Ambulatory Visit (HOSPITAL_COMMUNITY): Payer: Self-pay | Admitting: Psychiatry
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
## 2014-11-24 ENCOUNTER — Encounter (HOSPITAL_COMMUNITY): Payer: Self-pay | Admitting: Psychiatry

## 2014-11-24 ENCOUNTER — Ambulatory Visit (INDEPENDENT_AMBULATORY_CARE_PROVIDER_SITE_OTHER): Payer: Medicare Other | Admitting: Psychiatry

## 2014-11-24 VITALS — BP 107/70 | HR 98 | Ht 64.0 in | Wt 134.4 lb

## 2014-11-24 DIAGNOSIS — F319 Bipolar disorder, unspecified: Secondary | ICD-10-CM

## 2014-11-24 MED ORDER — CLONAZEPAM 0.5 MG PO TABS: ORAL_TABLET | ORAL | Status: DC

## 2014-11-24 MED ORDER — FLUOXETINE HCL 20 MG PO CAPS: 20.0000 mg | ORAL_CAPSULE | Freq: Every day | ORAL | Status: DC

## 2014-11-24 MED ORDER — LITHIUM CARBONATE ER 300 MG PO TBCR: 300.0000 mg | EXTENDED_RELEASE_TABLET | Freq: Two times a day (BID) | ORAL | Status: DC
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
## 2014-12-18 ENCOUNTER — Telehealth (HOSPITAL_COMMUNITY): Payer: Self-pay

## 2014-12-18 NOTE — Telephone Encounter (Signed)
Telephone message left for patient after she left one stating need for a new Lithium order.  Informed a new order was e-scribed into [facility] on [facility] by Dr. Lolly Mustache on 1/61/09 plus one refill so patient should have orders at her pharmacy.  Requested patient check with her pharmacy to see if these orders on file and to call us back if any problems getting medication filled.

## 2015-01-11 ENCOUNTER — Telehealth (HOSPITAL_COMMUNITY): Payer: Self-pay

## 2015-01-11 NOTE — Telephone Encounter (Signed)
I returned patient's phone call.  She is complaining of headaches and having high blood pressure.  I recommended to go urgent care for further checkup.  She accepted the recommendation.

## 2015-01-26 ENCOUNTER — Ambulatory Visit (INDEPENDENT_AMBULATORY_CARE_PROVIDER_SITE_OTHER): Payer: Medicare Other | Admitting: Psychiatry

## 2015-01-26 ENCOUNTER — Encounter (HOSPITAL_COMMUNITY): Payer: Self-pay | Admitting: Psychiatry

## 2015-01-26 VITALS — BP 107/78 | HR 98 | Ht 64.0 in | Wt 128.4 lb

## 2015-01-26 DIAGNOSIS — Z8669 Personal history of other diseases of the nervous system and sense organs: Secondary | ICD-10-CM

## 2015-01-26 DIAGNOSIS — F319 Bipolar disorder, unspecified: Secondary | ICD-10-CM

## 2015-01-26 DIAGNOSIS — Z87898 Personal history of other specified conditions: Secondary | ICD-10-CM

## 2015-01-26 MED ORDER — FLUOXETINE HCL 20 MG PO CAPS: 20.0000 mg | ORAL_CAPSULE | Freq: Every day | ORAL | Status: DC

## 2015-01-26 MED ORDER — LITHIUM CARBONATE ER 300 MG PO TBCR: 300.0000 mg | EXTENDED_RELEASE_TABLET | Freq: Two times a day (BID) | ORAL | Status: DC

## 2015-01-26 MED ORDER — CLONAZEPAM 0.5 MG PO TABS: 0.5000 mg | ORAL_TABLET | Freq: Every day | ORAL | Status: DC

## 2015-01-26 NOTE — Progress Notes (Signed)
Patient ID: KYIA RHUDE, female   DOB: 1985-05-11, 29 y.o.   MRN: [id number] Met with Dr. Adele Schilder after he evaluated patient today and would like patient referred to [facility] for a follow up evaluation.  Called and left a message for Diane, referral coordinator at [facility] patient information with name, medical record number, date of birth, reason for referral and phone number.  Informed on message an order was entered into [facility] for a neurology consult/evaluation and items from patient's visit today faxed to their office at [phone].  Requested they call this nurse back to confirm they would be setting up an appointment for patient and to call patient.  Instructed patient to call this nurse back if had not heard from their office by 01/29/15.
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
## 2015-02-03 ENCOUNTER — Encounter: Payer: Self-pay | Admitting: Neurology

## 2015-02-03 ENCOUNTER — Ambulatory Visit (INDEPENDENT_AMBULATORY_CARE_PROVIDER_SITE_OTHER): Payer: Medicare Other | Admitting: Neurology

## 2015-02-03 VITALS — BP 113/79 | HR 79 | Ht 64.0 in | Wt 127.0 lb

## 2015-02-03 DIAGNOSIS — R55 Syncope and collapse: Secondary | ICD-10-CM | POA: Insufficient documentation

## 2015-02-03 DIAGNOSIS — R5382 Chronic fatigue, unspecified: Secondary | ICD-10-CM | POA: Diagnosis not present

## 2015-02-03 DIAGNOSIS — R51 Headache: Secondary | ICD-10-CM | POA: Diagnosis not present

## 2015-02-03 DIAGNOSIS — G8929 Other chronic pain: Secondary | ICD-10-CM

## 2015-02-03 HISTORY — DX: Syncope and collapse: R55

## 2015-02-03 NOTE — Patient Instructions (Addendum)
We will get blood work today and get an EEG study and MRI evaluation of the brain.    Syncope Syncope is a medical term for fainting or passing out. This means you lose consciousness and drop to the ground. People are generally unconscious for less than 5 minutes. You may have some muscle twitches for up to 15 seconds before waking up and returning to normal. Syncope occurs more often in older adults, but it can happen to anyone. While most causes of syncope are not dangerous, syncope can be a sign of a serious medical problem. It is important to seek medical care.  CAUSES  Syncope is caused by a sudden drop in blood flow to the brain. The specific cause is often not determined. Factors that can bring on syncope include:  Taking medicines that lower blood pressure.  Sudden changes in posture, such as standing up quickly.  Taking more medicine than prescribed.  Standing in one place for too long.  Seizure disorders.  Dehydration and excessive exposure to heat.  Low blood sugar (hypoglycemia).  Straining to have a bowel movement.  Heart disease, irregular heartbeat, or other circulatory problems.  Fear, emotional distress, seeing blood, or severe pain. SYMPTOMS  Right before fainting, you may:  Feel dizzy or light-headed.  Feel nauseous.  See all white or all black in your field of vision.  Have cold, clammy skin. DIAGNOSIS  Your health care provider will ask about your symptoms, perform a physical exam, and perform an electrocardiogram (ECG) to record the electrical activity of your heart. Your health care provider may also perform other heart or blood tests to determine the cause of your syncope which may include:  Transthoracic echocardiogram (TTE). During echocardiography, sound waves are used to evaluate how blood flows through your heart.  Transesophageal echocardiogram (TEE).  Cardiac monitoring. This allows your health care provider to monitor your heart rate and  rhythm in real time.  Holter monitor. This is a portable device that records your heartbeat and can help diagnose heart arrhythmias. It allows your health care provider to track your heart activity for several days, if needed.  Stress tests by exercise or by giving medicine that makes the heart beat faster. TREATMENT  In most cases, no treatment is needed. Depending on the cause of your syncope, your health care provider may recommend changing or stopping some of your medicines. HOME CARE INSTRUCTIONS  Have someone stay with you until you feel stable.  Do not drive, use machinery, or play sports until your health care provider says it is okay.  Keep all follow-up appointments as directed by your health care provider.  Lie down right away if you start feeling like you might faint. Breathe deeply and steadily. Wait until all the symptoms have passed.  Drink enough fluids to keep your urine clear or pale yellow.  If you are taking blood pressure or heart medicine, get up slowly and take several minutes to sit and then stand. This can reduce dizziness. SEEK IMMEDIATE MEDICAL CARE IF:   You have a severe headache.  You have unusual pain in the chest, abdomen, or back.  You are bleeding from your mouth or rectum, or you have black or tarry stool.  You have an irregular or very fast heartbeat.  You have pain with breathing.  You have repeated fainting or seizure-like jerking during an episode.  You faint when sitting or lying down.  You have confusion.  You have trouble walking.  You have severe  weakness.  You have vision problems. If you fainted, call your local emergency services (911 in [place] Do not drive yourself to the hospital.    This information is not intended to replace advice given to you by your health care provider. Make sure you discuss any questions you have with your health care provider.   Document Released: 03/13/2005 Document Revised: 07/28/2014 Document  Reviewed: 05/12/2011 Elsevier Interactive Patient Education [employer]
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
## 2015-02-04 ENCOUNTER — Telehealth: Payer: Self-pay

## 2015-02-04 LAB — COMPREHENSIVE METABOLIC PANEL
ALBUMIN: 4.5 g/dL (ref 3.5–5.5)
ALK PHOS: 79 IU/L (ref 39–117)
ALT: 9 IU/L (ref 0–32)
AST: 9 IU/L (ref 0–40)
Albumin/Globulin Ratio: 1.8 (ref 1.1–2.5)
BUN / CREAT RATIO: 17 (ref 8–20)
BUN: 9 mg/dL (ref 6–20)
Bilirubin Total: 0.3 mg/dL (ref 0.0–1.2)
CHLORIDE: 103 mmol/L (ref 97–106)
CO2: 20 mmol/L (ref 18–29)
Calcium: 9.2 mg/dL (ref 8.7–10.2)
Creatinine, Ser: 0.53 mg/dL — ABNORMAL LOW (ref 0.57–1.00)
GFR calc Af Amer: 148 mL/min/{1.73_m2} (ref >59)
GFR calc non Af Amer: 129 mL/min/{1.73_m2} (ref >59)
GLUCOSE: 88 mg/dL (ref 65–99)
Globulin, Total: 2.5 g/dL (ref 1.5–4.5)
Potassium: 4.1 mmol/L (ref 3.5–5.2)
Sodium: 140 mmol/L (ref 136–144)
Total Protein: 7 g/dL (ref 6.0–8.5)

## 2015-02-04 LAB — CBC WITH DIFFERENTIAL/PLATELET
Basophils Absolute: 0 10*3/uL (ref 0.0–0.2)
Basos: 0 %
EOS (ABSOLUTE): 0.1 10*3/uL (ref 0.0–0.4)
Eos: 2 %
Hematocrit: 42.2 % (ref 34.0–46.6)
Hemoglobin: 14.4 g/dL (ref 11.1–15.9)
Immature Grans (Abs): 0 10*3/uL (ref 0.0–0.1)
Immature Granulocytes: 0 %
Lymphocytes Absolute: 2.5 10*3/uL (ref 0.7–3.1)
Lymphs: 27 %
MCH: 30.4 pg (ref 26.6–33.0)
MCHC: 34.1 g/dL (ref 31.5–35.7)
MCV: 89 fL (ref 79–97)
Monocytes Absolute: 0.4 10*3/uL (ref 0.1–0.9)
Monocytes: 5 %
Neutrophils Absolute: 5.9 10*3/uL (ref 1.4–7.0)
Neutrophils: 66 %
Platelets: 299 10*3/uL (ref 150–379)
RBC: 4.73 x10E6/uL (ref 3.77–5.28)
RDW: 13.1 % (ref 12.3–15.4)
WBC: 9 10*3/uL (ref 3.4–10.8)

## 2015-02-04 LAB — TSH: TSH: 1.43 u[IU]/mL (ref 0.450–4.500)

## 2015-02-04 LAB — LITHIUM LEVEL: Lithium Lvl: 0.1 mmol/L — ABNORMAL LOW (ref 0.6–1.4)

## 2015-02-04 NOTE — Telephone Encounter (Signed)
-----   Message from York Spanielharles K Willis, MD sent at 02/04/2015  7:48 AM EST -----  The blood work results are unremarkable, with exception that the lithium level is low. No concerns. Please call the patient. ----- Message -----    From: [facility] Lab Results In Interface    Sent: 02/04/2015   7:45 AM      To: York Spanielharles K Willis, MD

## 2015-02-04 NOTE — Telephone Encounter (Signed)
Called patient to give results. No answer. Will try later.  

## 2015-02-08 NOTE — Progress Notes (Signed)
This encounter was created in error - please disregard.

## 2015-02-08 NOTE — Telephone Encounter (Signed)
Tried reaching patient. No answer. Will send letter.  

## 2015-02-15 NOTE — Telephone Encounter (Signed)
I called the patient and relayed results. 

## 2015-02-15 NOTE — Telephone Encounter (Addendum)
Pt called to know about her lab results. Please call and advise [phone]

## 2015-03-03 ENCOUNTER — Telehealth: Payer: Self-pay | Admitting: Adult Health

## 2015-03-03 NOTE — Telephone Encounter (Signed)
I called patient. We do not prescribe the Prozac, the patient needs to direct questions regarding this medication to the doctor that is giving her that prescription. She does not need to stay awake at night prior to the EEG study.

## 2015-03-03 NOTE — Telephone Encounter (Addendum)
Pt called sts she stopped the prozac for 5 days after taking it for 2 mths because she has a pregnant cat that is due anytime. She was having severe pressure on top of head and a feeling of passing out while on it which subsided after stopping. She started it again and symptoms started again. She also is inquiring if she should stay awake the night prior to EEG on the 12/12. Please call and advise

## 2015-03-08 ENCOUNTER — Ambulatory Visit (INDEPENDENT_AMBULATORY_CARE_PROVIDER_SITE_OTHER): Payer: Self-pay

## 2015-03-08 DIAGNOSIS — R55 Syncope and collapse: Secondary | ICD-10-CM

## 2015-03-08 DIAGNOSIS — R51 Headache: Secondary | ICD-10-CM

## 2015-03-08 DIAGNOSIS — G8929 Other chronic pain: Secondary | ICD-10-CM

## 2015-03-08 DIAGNOSIS — Z0289 Encounter for other administrative examinations: Secondary | ICD-10-CM

## 2015-03-12 ENCOUNTER — Telehealth: Payer: Self-pay | Admitting: Neurology

## 2015-03-12 ENCOUNTER — Ambulatory Visit (INDEPENDENT_AMBULATORY_CARE_PROVIDER_SITE_OTHER): Payer: Medicare Other

## 2015-03-12 DIAGNOSIS — R55 Syncope and collapse: Secondary | ICD-10-CM

## 2015-03-12 NOTE — Procedures (Signed)
    History:  Verlee MonteGloria Haney is a 29 year old patient with a history of episodes of near syncope that occurred several times daily, the patient may feel lightheaded as if she may black out, she has not lost consciousness. She may feel hot with pressure sensations in the head. She reports some palpitations of the heart. She is being evaluated for these events.  This is a routine EEG. No skull defects are noted. Medications include clonazepam, Prozac, and lithium.    EEG classification: Normal awake  Description of the recording: The background rhythms of this recording consists of a fairly well modulated medium amplitude alpha rhythm of 9 Hz that is reactive to eye opening and closure. As the record progresses, the patient appears to remain in the waking state throughout the recording. Photic stimulation was performed, resulting in a bilateral and symmetric photic driving response. Hyperventilation was also performed, resulting in a minimal buildup of the background rhythm activities without significant slowing seen. At no time during the recording does there appear to be evidence of spike or spike wave discharges or evidence of focal slowing. EKG monitor shows no evidence of cardiac rhythm abnormalities with a heart rate of 60.  Impression: This is a normal EEG recording in the waking state. No evidence of ictal or interictal discharges are seen.

## 2015-03-12 NOTE — Telephone Encounter (Signed)
I called the patient the EEG study was unremarkable. MRI of the brain is pending.

## 2015-03-18 ENCOUNTER — Other Ambulatory Visit: Payer: Self-pay

## 2015-03-23 ENCOUNTER — Other Ambulatory Visit: Payer: Self-pay

## 2015-03-30 ENCOUNTER — Ambulatory Visit
Admission: RE | Admit: 2015-03-30 | Discharge: 2015-03-30 | Disposition: A | Discharge status: Home / Self Care | Payer: Medicare Other | Source: Ambulatory Visit | Attending: Neurology | Admitting: Neurology

## 2015-03-30 ENCOUNTER — Telehealth: Payer: Self-pay | Admitting: Neurology

## 2015-03-30 DIAGNOSIS — R55 Syncope and collapse: Secondary | ICD-10-CM

## 2015-03-30 DIAGNOSIS — R519 Headache, unspecified: Secondary | ICD-10-CM

## 2015-03-30 DIAGNOSIS — R51 Headache: Secondary | ICD-10-CM

## 2015-03-30 DIAGNOSIS — R0602 Shortness of breath: Secondary | ICD-10-CM
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
## 2015-03-31 ENCOUNTER — Telehealth: Payer: Self-pay | Admitting: Neurology

## 2015-03-31 ENCOUNTER — Encounter (HOSPITAL_COMMUNITY): Payer: Self-pay | Admitting: Psychiatry

## 2015-03-31 ENCOUNTER — Ambulatory Visit (INDEPENDENT_AMBULATORY_CARE_PROVIDER_SITE_OTHER): Payer: Medicare Other | Admitting: Psychiatry

## 2015-03-31 VITALS — BP 101/74 | HR 77 | Ht 64.0 in | Wt 125.2 lb

## 2015-03-31 DIAGNOSIS — F319 Bipolar disorder, unspecified: Secondary | ICD-10-CM

## 2015-03-31 MED ORDER — CLONAZEPAM 0.5 MG PO TABS: 0.5000 mg | ORAL_TABLET | Freq: Two times a day (BID) | ORAL | Status: DC

## 2015-03-31 MED ORDER — FLUOXETINE HCL 20 MG PO CAPS: 20.0000 mg | ORAL_CAPSULE | Freq: Every day | ORAL | Status: DC

## 2015-03-31 NOTE — Telephone Encounter (Signed)
Called patient as she called the after hours call line for symptoms of dizziness and low BP. She is advised that she should try to hydrate well, change positions slowly, and I will be sure to forward her concerns to Dr. Anne HahnWillis. She is furthermore advised to establish care with an internist or primary care physician for a thorough general checkup.

## 2015-03-31 NOTE — Telephone Encounter (Signed)
Pt returned Dr Willis call.  °
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
## 2015-03-31 NOTE — Telephone Encounter (Signed)
I called the patient. She stated that the symptoms she described when she called are not different than the same ones she has already discussed with Dr. Anne HahnWillis. Dr. Anne HahnWillis has ordered a cardiac monitor and a chest x-ray on the patient. I advised that if her symptoms changed or were worse, then she should seek urgent care. She had questions about the bleeding that the MRI showed. I advised that I do not see any documentation of this in Dr. Anne HahnWillis' note. I tried to explained Dr. Anne HahnWillis' note to her, but she stated that she was sure he told her she has a bleed. She feels that she should be hospitalized for this. I advised that I would ask Dr. Anne HahnWillis about this for clarification and we would call her back.
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
## 2015-03-31 NOTE — Telephone Encounter (Signed)
Patient is calling back to discuss the MRI. She states last night she started experiencing dizziness, head pressure and is now having chest pain. I advised her to go to the ER but she wants to talk to our office first.

## 2015-03-31 NOTE — Telephone Encounter (Signed)
I called the patient back, left a message.

## 2015-04-01 ENCOUNTER — Ambulatory Visit (INDEPENDENT_AMBULATORY_CARE_PROVIDER_SITE_OTHER): Payer: Medicare Other

## 2015-04-01 DIAGNOSIS — R55 Syncope and collapse: Secondary | ICD-10-CM

## 2015-04-01 DIAGNOSIS — R0602 Shortness of breath: Secondary | ICD-10-CM | POA: Diagnosis not present

## 2015-04-01 DIAGNOSIS — R002 Palpitations: Secondary | ICD-10-CM | POA: Diagnosis not present

## 2015-04-17 ENCOUNTER — Telehealth: Payer: Self-pay | Admitting: Neurology

## 2015-04-17 NOTE — Telephone Encounter (Signed)
She called over weekend that she is having a lot more headaches, with pressure quality.   No neurologic symptpoms  Reviewed MRI from 2-3 weeks ago showing some White matter foci, 2 of which associated with microhemorrhage  Since on Lithium, can't take NSAID.   Advised could take Tylenol but if headache worsens should go to ER  I let her know I would forward message to you and she would like to be seen asap for her headaches

## 2015-04-19 ENCOUNTER — Telehealth: Payer: Self-pay

## 2015-04-19 NOTE — Telephone Encounter (Signed)
Patient called back. Appointment scheduled 1/24.

## 2015-04-19 NOTE — Telephone Encounter (Signed)
I called patient and left voicemail. Dr. Anne Hahn wants the patient to come in for a RV soon d/t headache.

## 2015-04-20 ENCOUNTER — Ambulatory Visit (INDEPENDENT_AMBULATORY_CARE_PROVIDER_SITE_OTHER): Payer: Medicare Other | Admitting: Neurology

## 2015-04-20 ENCOUNTER — Encounter: Payer: Self-pay | Admitting: Neurology

## 2015-04-20 VITALS — BP 103/72 | HR 73 | Ht 64.0 in | Wt 131.0 lb

## 2015-04-20 DIAGNOSIS — G441 Vascular headache, not elsewhere classified: Secondary | ICD-10-CM | POA: Diagnosis not present

## 2015-04-20 DIAGNOSIS — R51 Headache: Secondary | ICD-10-CM

## 2015-04-20 DIAGNOSIS — R55 Syncope and collapse: Secondary | ICD-10-CM

## 2015-04-20 DIAGNOSIS — R519 Headache, unspecified: Secondary | ICD-10-CM

## 2015-04-20 HISTORY — DX: Headache, unspecified: R51.9

## 2015-04-20 MED ORDER — TIZANIDINE HCL 2 MG PO TABS: 2.0000 mg | ORAL_TABLET | Freq: Three times a day (TID) | ORAL | Status: DC

## 2015-04-20 NOTE — Patient Instructions (Signed)
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
## 2015-05-03 ENCOUNTER — Ambulatory Visit (HOSPITAL_COMMUNITY): Payer: Self-pay | Admitting: Psychiatry

## 2015-05-06 ENCOUNTER — Ambulatory Visit: Payer: Medicare Other | Admitting: Adult Health

## 2015-05-09 ENCOUNTER — Telehealth: Payer: Self-pay | Admitting: Neurology

## 2015-05-09 NOTE — Telephone Encounter (Signed)
I called the patient. The cardiac monitor study looks OK.

## 2015-05-12 ENCOUNTER — Encounter (HOSPITAL_COMMUNITY): Payer: Self-pay | Admitting: Psychiatry

## 2015-05-12 ENCOUNTER — Ambulatory Visit (INDEPENDENT_AMBULATORY_CARE_PROVIDER_SITE_OTHER): Payer: Medicare Other | Admitting: Psychiatry

## 2015-05-12 ENCOUNTER — Ambulatory Visit (HOSPITAL_COMMUNITY): Payer: Self-pay | Admitting: Clinical

## 2015-05-12 VITALS — BP 111/74 | HR 91 | Ht 64.0 in | Wt 122.4 lb

## 2015-05-12 DIAGNOSIS — F319 Bipolar disorder, unspecified: Secondary | ICD-10-CM

## 2015-05-12 MED ORDER — LITHIUM CARBONATE ER 300 MG PO TBCR: 300.0000 mg | EXTENDED_RELEASE_TABLET | Freq: Two times a day (BID) | ORAL | Status: DC

## 2015-05-12 MED ORDER — FLUOXETINE HCL 20 MG PO CAPS: 20.0000 mg | ORAL_CAPSULE | Freq: Every day | ORAL | Status: DC

## 2015-05-12 MED ORDER — CLONAZEPAM 0.5 MG PO TABS: 0.5000 mg | ORAL_TABLET | Freq: Two times a day (BID) | ORAL | Status: DC
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
## 2015-05-17 ENCOUNTER — Encounter (HOSPITAL_COMMUNITY): Payer: Self-pay | Admitting: Emergency Medicine

## 2015-05-17 ENCOUNTER — Emergency Department (HOSPITAL_COMMUNITY): Payer: Medicare Other

## 2015-05-17 ENCOUNTER — Inpatient Hospital Stay (HOSPITAL_COMMUNITY)
Admission: EM | Admit: 2015-05-17 | Discharge: 2015-05-21 | DRG: 419 | Disposition: A | Discharge status: Home / Self Care | Payer: Medicare Other | Attending: Family Medicine | Admitting: Family Medicine

## 2015-05-17 DIAGNOSIS — K8000 Calculus of gallbladder with acute cholecystitis without obstruction: Secondary | ICD-10-CM | POA: Diagnosis not present

## 2015-05-17 DIAGNOSIS — F17210 Nicotine dependence, cigarettes, uncomplicated: Secondary | ICD-10-CM | POA: Diagnosis present

## 2015-05-17 DIAGNOSIS — M419 Scoliosis, unspecified: Secondary | ICD-10-CM | POA: Diagnosis present

## 2015-05-17 DIAGNOSIS — Z79899 Other long term (current) drug therapy: Secondary | ICD-10-CM | POA: Diagnosis not present

## 2015-05-17 DIAGNOSIS — K810 Acute cholecystitis: Secondary | ICD-10-CM | POA: Diagnosis present

## 2015-05-17 DIAGNOSIS — R1084 Generalized abdominal pain: Secondary | ICD-10-CM | POA: Diagnosis not present

## 2015-05-17 DIAGNOSIS — I959 Hypotension, unspecified: Secondary | ICD-10-CM | POA: Diagnosis not present

## 2015-05-17 DIAGNOSIS — K8020 Calculus of gallbladder without cholecystitis without obstruction: Secondary | ICD-10-CM | POA: Diagnosis not present

## 2015-05-17 DIAGNOSIS — F319 Bipolar disorder, unspecified: Secondary | ICD-10-CM | POA: Diagnosis present

## 2015-05-17 DIAGNOSIS — R1011 Right upper quadrant pain: Secondary | ICD-10-CM | POA: Diagnosis not present

## 2015-05-17 DIAGNOSIS — K8010 Calculus of gallbladder with chronic cholecystitis without obstruction: Secondary | ICD-10-CM | POA: Diagnosis not present

## 2015-05-17 DIAGNOSIS — M1990 Unspecified osteoarthritis, unspecified site: Secondary | ICD-10-CM | POA: Diagnosis not present

## 2015-05-17 DIAGNOSIS — K828 Other specified diseases of gallbladder: Secondary | ICD-10-CM | POA: Diagnosis not present

## 2015-05-17 DIAGNOSIS — G4700 Insomnia, unspecified: Secondary | ICD-10-CM | POA: Diagnosis present

## 2015-05-17 DIAGNOSIS — R079 Chest pain, unspecified: Secondary | ICD-10-CM | POA: Diagnosis not present

## 2015-05-17 DIAGNOSIS — F410 Panic disorder [episodic paroxysmal anxiety] without agoraphobia: Secondary | ICD-10-CM | POA: Diagnosis present

## 2015-05-17 HISTORY — DX: Bipolar disorder, unspecified: F31.9

## 2015-05-17 LAB — LIPASE, BLOOD
LIPASE: 25 U/L (ref 11–51)
Lipase: 20 U/L (ref 11–51)

## 2015-05-17 LAB — BASIC METABOLIC PANEL
ANION GAP: 13 (ref 5–15)
BUN: 8 mg/dL (ref 6–20)
CHLORIDE: 108 mmol/L (ref 101–111)
CO2: 20 mmol/L — AB (ref 22–32)
Calcium: 9.2 mg/dL (ref 8.9–10.3)
Creatinine, Ser: 0.65 mg/dL (ref 0.44–1.00)
GFR calc Af Amer: >60 mL/min (ref >60)
GLUCOSE: 124 mg/dL — AB (ref 65–99)
POTASSIUM: 3.8 mmol/L (ref 3.5–5.1)
Sodium: 141 mmol/L (ref 135–145)

## 2015-05-17 LAB — RAPID URINE DRUG SCREEN, HOSP PERFORMED
AMPHETAMINES: NOT DETECTED
BENZODIAZEPINES: NOT DETECTED
Barbiturates: NOT DETECTED
Cocaine: NOT DETECTED
OPIATES: NOT DETECTED
Tetrahydrocannabinol: NOT DETECTED

## 2015-05-17 LAB — COMPREHENSIVE METABOLIC PANEL
ALK PHOS: 60 U/L (ref 38–126)
ALT: 15 U/L (ref 14–54)
AST: 22 U/L (ref 15–41)
Albumin: 3.9 g/dL (ref 3.5–5.0)
Anion gap: 12 (ref 5–15)
BUN: 9 mg/dL (ref 6–20)
CHLORIDE: 108 mmol/L (ref 101–111)
CO2: 22 mmol/L (ref 22–32)
Calcium: 9.2 mg/dL (ref 8.9–10.3)
Creatinine, Ser: 0.67 mg/dL (ref 0.44–1.00)
GFR calc Af Amer: >60 mL/min (ref >60)
Glucose, Bld: 121 mg/dL — ABNORMAL HIGH (ref 65–99)
POTASSIUM: 4.1 mmol/L (ref 3.5–5.1)
Sodium: 142 mmol/L (ref 135–145)
Total Bilirubin: <0.1 mg/dL — ABNORMAL LOW (ref 0.3–1.2)
Total Protein: 6.6 g/dL (ref 6.5–8.1)

## 2015-05-17 LAB — CBC
HEMATOCRIT: 44 % (ref 36.0–46.0)
HEMOGLOBIN: 14.4 g/dL (ref 12.0–15.0)
MCH: 29.8 pg (ref 26.0–34.0)
MCHC: 32.7 g/dL (ref 30.0–36.0)
MCV: 91.1 fL (ref 78.0–100.0)
Platelets: 302 10*3/uL (ref 150–400)
RBC: 4.83 MIL/uL (ref 3.87–5.11)
RDW: 12.4 % (ref 11.5–15.5)
WBC: 9.6 10*3/uL (ref 4.0–10.5)

## 2015-05-17 LAB — I-STAT TROPONIN, ED: Troponin i, poc: 0 ng/mL (ref 0.00–0.08)

## 2015-05-17 MED ORDER — ACETAMINOPHEN 650 MG RE SUPP
650.0000 mg | Freq: Four times a day (QID) | RECTAL | Status: DC | PRN
Start: 2015-05-17 — End: 2015-05-21

## 2015-05-17 MED ORDER — ONDANSETRON HCL 4 MG/2ML IJ SOLN
4.0000 mg | Freq: Four times a day (QID) | INTRAMUSCULAR | Status: DC | PRN
  Administered 2015-05-19 – 2015-05-20 (×2): 4 mg via INTRAVENOUS
  Filled 2015-05-17: qty 2

## 2015-05-17 MED ORDER — ACETAMINOPHEN 325 MG PO TABS
650.0000 mg | ORAL_TABLET | Freq: Four times a day (QID) | ORAL | Status: DC | PRN
Start: 2015-05-17 — End: 2015-05-21
  Administered 2015-05-17: 150 mg via ORAL
  Administered 2015-05-18 – 2015-05-19 (×2): 325 mg via ORAL
  Administered 2015-05-20 – 2015-05-21 (×2): 650 mg via ORAL
  Filled 2015-05-17 (×5): qty 2

## 2015-05-17 MED ORDER — ENOXAPARIN SODIUM 40 MG/0.4ML ~~LOC~~ SOLN
40.0000 mg | SUBCUTANEOUS | Status: DC
  Administered 2015-05-17 – 2015-05-20 (×3): 40 mg via SUBCUTANEOUS
  Filled 2015-05-17 (×3): qty 0.4

## 2015-05-17 MED ORDER — ONDANSETRON HCL 4 MG PO TABS: 4.0000 mg | ORAL_TABLET | Freq: Four times a day (QID) | ORAL | Status: DC | PRN

## 2015-05-17 MED ORDER — FLUOXETINE HCL 20 MG PO CAPS
20.0000 mg | ORAL_CAPSULE | Freq: Every day | ORAL | Status: DC
  Administered 2015-05-17 – 2015-05-21 (×5): 20 mg via ORAL
  Filled 2015-05-17 (×5): qty 1

## 2015-05-17 MED ORDER — LITHIUM CARBONATE ER 300 MG PO TBCR
300.0000 mg | EXTENDED_RELEASE_TABLET | Freq: Two times a day (BID) | ORAL | Status: DC
  Administered 2015-05-17 – 2015-05-21 (×8): 300 mg via ORAL
  Filled 2015-05-17 (×8): qty 1

## 2015-05-17 MED ORDER — SODIUM CHLORIDE 0.9 % IV BOLUS (SEPSIS)
1000.0000 mL | Freq: Once | INTRAVENOUS | Status: AC
  Administered 2015-05-17: 1000 mL via INTRAVENOUS

## 2015-05-17 NOTE — ED Notes (Signed)
Pt ambulated to restroom. 

## 2015-05-17 NOTE — H&P (Signed)
Family Medicine Teaching [facility] Admission History and Physical Service Pager: [phone]  Patient name: Kimberly Lynch Medical record number: [id number] Date of birth: 02-16-1986 Age: 30 y.o. Gender: female  Primary Care Provider: No Pcp Consultants: Surgery  Code Status: Full   Chief Complaint: Upper Quadrant Abdominal Pain   Assessment and Plan: Kimberly Lynch is a 30 y.o. female presenting with right upper quadrant abdominal pain . PMH is significant for anxiety and bipolar 1 disorder.  Upper quadrant abdominal pain: History of intermittent abdominal pain over the course of a few months. Acute onset of constant pain last night around 3 AM, remitting at 7 AM this morning. Patient was afebrile on admission, with white blood cell count of 9.6. Right upper quadrant ultrasound was performed to begin for multiple gallstones with moderate gallbladder wall thickening concerning for acute cholecystitis. Differential to include acute cholecystitis versus pancreatitis versus gastric ulcer. However likely cholecystitis per imaging and location of pain.   - Admit to family medicine teaching service Dr. Pollie Meyer - Surgery consulted, indicates patient will need a HIDA scan tomorrow - CBC and BMET AM - Lipase to rule out pancreatitis - Vitals per floor protocol  - No recs for antibiotics at this time - Nothing by mouth after midnight for HIDA scan  Bipolar 1 disorder/Anxiety: Takes lithium (last dose 2 days ago) and Prozac - Will continue these medications - Will get UDS   FEN/GI: Diet NPO after midnight   Prophylaxis: Lovenox   Disposition: Med-surg   History of Present Illness:  Kimberly Lynch is a 30 y.o. female presenting with right sided upper abdominal pain. Pain started around 11pm last night, located ar RUQ  and right flank. Pain was present persistently and she was driven to the ED via private vehicle. She arrived at the ED around 3am and pain resolved by 7am, however patient  continued to have soreness. She has had similar pain in the past, x 4 episodes but is unsure as to what precipitated pain in the past. No fevers, chills, nausea or vomiting.   Review Of Systems: Per HPI with the following additions:  No headaches, abdominal pain, dysuria, frequency, hematochezia. Positive for chest pain and dyspnea. Otherwise the remainder of the systems were negative.  Patient Active Problem List   Diagnosis Date Noted  . Cholelithiases 05/17/2015  . Acute cholecystitis 05/17/2015  . Headache 04/20/2015  . Near syncope 02/03/2015    Past Medical History: Past Medical History  Diagnosis Date  . Scoliosis   . Arthritis   . Anxiety   . Panic   . Depression   . Near syncope 02/03/2015  . Headache 04/20/2015  . Bipolar 1 disorder [facility])     Past Surgical History: Past Surgical History  Procedure Laterality Date  . Fracture surgery    . Tubal ligation      Social History: Social History  Substance Use Topics  . Smoking status: Current Every Day Smoker -- 0.40 packs/day for 22 years    Types: Cigarettes  . Smokeless tobacco: Never Used  . Alcohol Use: 0.0 oz/week    0 Standard drinks or equivalent per week     Comment: Previous drinker. Quit 2 months ago   Additional social history: None Please also refer to relevant sections of EMR.  Family History: Family History  Problem Relation Age of Onset  . Adopted: Yes  . Cancer Other   . Diabetes Other     Allergies and Medications: Allergies  Allergen Reactions  .  Other Other (See Comments)    Patient took an over the counter cold medication last year. She felt really hot. She can't remember what the name was.   No current facility-administered medications on file prior to encounter.   Current Outpatient Prescriptions on File Prior to Encounter  Medication Sig Dispense Refill  . clonazePAM (KLONOPIN) 0.5 MG tablet Take 1 tablet (0.5 mg total) by mouth 2 (two) times daily. 60 tablet 1  . FLUoxetine  (PROZAC) 20 MG capsule Take 1 capsule (20 mg total) by mouth daily. 30 capsule 1  . lithium carbonate (LITHOBID) 300 MG CR tablet Take 1 tablet (300 mg total) by mouth 2 (two) times daily. 60 tablet 1  . tiZANidine (ZANAFLEX) 2 MG tablet Take 1 tablet (2 mg total) by mouth 3 (three) times daily. (Patient not taking: Reported on 05/17/2015) 90 tablet 2    Objective: BP 103/76 mmHg  Pulse 65  Temp(Src) 98.3 F (36.8 C) (Oral)  Resp 17  Ht  (1.626 m)  Wt 127 lb (57.607 kg)  BMI 21.79 kg/m2  SpO2 98%  LMP 05/06/2015 (Approximate) Exam: General: Lying bed, NAD  Eyes:  PERRLA  ENTM: Mucosa membranes moist  Neck: Normal range of motion Cardiovascular: RRR, no murmurs, rubs or gallops  Respiratory: CTAB, no wheezes, rhonchi,  Abdomen: BS+,right upper quadrant tenderness,   MSK: No lower extremity edema  Skin: no rashes or ulcers  Neuro: Equal upper and lower extremity edema bilaterally  Psych: Alert and orientated x3,   Labs and Imaging: CBC BMET   Recent Labs Lab 05/17/15 0344  WBC 9.6  HGB 14.4  HCT 44.0  PLT 302    Recent Labs Lab 05/17/15 0344  NA 142  141  K 4.1  3.8  CL 108  108  CO2 22  20*  BUN 9  8  CREATININE 0.67  0.65  GLUCOSE 121*  124*  CALCIUM 9.2  9.2     Dg Chest 2 View  05/17/2015  CLINICAL DATA:  Chronic lower chest pain.  Initial encounter. EXAM: CHEST  2 VIEW COMPARISON:  None. FINDINGS: The lungs are well-aerated. There is no evidence of focal opacification, pleural effusion or pneumothorax. The heart is normal in size; the mediastinal contour is within normal limits. No acute osseous abnormalities are seen. Right convex thoracic scoliosis is noted. IMPRESSION: 1. No acute cardiopulmonary process seen. 2. Right convex thoracic scoliosis noted. Electronically Signed   By: Roanna Raider M.D.   On: 05/17/2015 03:53   US Abdomen Limited Ruq  05/17/2015  CLINICAL DATA:  Right upper quadrant pain since last night. EXAM: US ABDOMEN LIMITED  - RIGHT UPPER QUADRANT COMPARISON:  Abdominal ultrasound 06/17/2010 FINDINGS: Gallbladder: Multiple mobile gallstones are present measuring up to 7 mm in size. The gallbladder wall is moderately thickened, measuring 7 mm though some of this may be due to mild underdistention. No pericholecystic fluid was identified. Negative sonographic Murphy sign. Common bile duct: Diameter: 4 mm Liver: No focal lesion identified. Within normal limits in parenchymal echogenicity. IMPRESSION: 1. Multiple mobile gallstones with moderate gallbladder wall thickening. No sonographic Murphy sign, however acute cholecystitis is a concern in the appropriate clinical setting. 2. No biliary dilatation. Electronically Signed   By: Sebastian Ache M.D.   On: 05/17/2015 10:45    Reah Justo Mayra Reel, MD 05/17/2015, 4:49 PM PGY-1, [facility] Family Medicine FPTS Intern pager: [phone], text pages welcome

## 2015-05-17 NOTE — ED Notes (Signed)
Pt. reports central chest pain , upper abdominal pain and mid back pain onset last night , denies SOB , no nausea or diaphoresis . Denies fever or chills.

## 2015-05-17 NOTE — ED Notes (Signed)
Report attempted nurse to call back.  

## 2015-05-17 NOTE — Consult Note (Signed)
Reason for Consult:  Abdominal pain, and gallstones Referring Physician:  Fredrich Romans PA-C   Kimberly Lynch is an 30 y.o. female.  HPI: pt presents with chest pain and upper abdominal pain, mid back pain starting last PM. No fever or chills. No nausea or vomiting, say it was like labor pain.  She has had this before and it last up to several hours.  I am not sure what made her come today as opposed to prior attacks.  She says pain started last PM and is in the RUQ, and it is still tender, but pain is better right now.  She has recently had an MRi for syncope and headaches per Dr. Margette Fast.  This was abnormal with 2 sites consistent for chronic ischemic foci, consistent with chronic microhemorage. Several other smaller foci in the subcortical and deep white matter that are nonspecific and could be idiopathic or due to chronic ischemic change, migraine, demyelination or be the sequela of trauma or infection.   Work up in the ED so far shows:  She is afebrile so far, but BP is down.  Labs show a normal CBC and CMP.  RUQ ultrasound shows:  1. Multiple mobile gallstones with moderate gallbladder wall thickening. No sonographic Murphy sign, however acute cholecystitis is a concern in the appropriate clinical setting.  No biliary dilatation.  EKG shows prolonged QTC.  She does have pain in the RUQ, despite her labs.    Past Medical History  Diagnosis Date  Bipolar 1 disorder ([place]) Multiple hospitalization   Anxiety   Panic attacks   Depression   Near syncope 02/03/2015  Headache 04/20/2015  Insomnia   Tobacco use   ETOH - Quit some time ago, she is not sure when   . Scoliosis/Arthritis          Past Surgical History  Procedure Laterality Date  . Fracture surgery    . Tubal ligation      Family History  Problem Relation Age of Onset  . Adopted: Yes  . Cancer Other   . Diabetes Other    Social History:  reports that she has been smoking Cigarettes.  She has been smoking about 0.40  packs per day for the past 0 years. She has never used smokeless tobacco. She reports that she does not drink alcohol or use illicit drugs.   tobacco <1PPD since age 57 Drugs:  Prior Marijuana use, none x 1 month ETOH: Quit but not sure when  Allergies:  Allergies  Allergen Reactions  . Other Other (See Comments)    Patient took an over the counter cold medication last year. She felt really hot. She can't remember what the name was.    Prior to Admission medications   Medication Sig Start Date End Date Taking? Authorizing Provider  clonazePAM (KLONOPIN) 0.5 MG tablet Take 1 tablet (0.5 mg total) by mouth 2 (two) times daily. 05/12/15  Yes Kathlee Nations, MD  DM-Doxylamine-Acetaminophen (NYQUIL COLD & FLU PO) Take 1 capsule by mouth at bedtime as needed (sleep).   Yes Historical Provider, MD  FLUoxetine (PROZAC) 20 MG capsule Take 1 capsule (20 mg total) by mouth daily. 05/12/15 05/11/16 Yes Kathlee Nations, MD  lithium carbonate (LITHOBID) 300 MG CR tablet Take 1 tablet (300 mg total) by mouth 2 (two) times daily. 05/12/15  Yes Kathlee Nations, MD  tiZANidine (ZANAFLEX) 2 MG tablet Take 1 tablet (2 mg total) by mouth 3 (three) times daily. Patient not taking: Reported  on 05/17/2015 04/20/15   Kathrynn Ducking, MD     Results for orders placed or performed during the hospital encounter of 05/17/15 (from the past 48 hour(s))  Basic metabolic panel     Status: Abnormal   Collection Time: 05/17/15  3:44 AM  Result Value Ref Range   Sodium 141 135 - 145 mmol/L   Potassium 3.8 3.5 - 5.1 mmol/L   Chloride 108 101 - 111 mmol/L   CO2 20 (L) 22 - 32 mmol/L   Glucose, Bld 124 (H) 65 - 99 mg/dL   BUN 8 6 - 20 mg/dL   Creatinine, Ser 0.65 0.44 - 1.00 mg/dL   Calcium 9.2 8.9 - 10.3 mg/dL   GFR calc non Af Amer >60 >60 mL/min   GFR calc Af Amer >60 >60 mL/min    Comment: (NOTE) The eGFR has been calculated using the CKD EPI equation. This calculation has not been validated in all clinical  situations. eGFR's persistently <60 mL/min signify possible Chronic Kidney Disease.    Anion gap 13 5 - 15  CBC     Status: None   Collection Time: 05/17/15  3:44 AM  Result Value Ref Range   WBC 9.6 4.0 - 10.5 K/uL   RBC 4.83 3.87 - 5.11 MIL/uL   Hemoglobin 14.4 12.0 - 15.0 g/dL   HCT 44.0 36.0 - 46.0 %   MCV 91.1 78.0 - 100.0 fL   MCH 29.8 26.0 - 34.0 pg   MCHC 32.7 30.0 - 36.0 g/dL   RDW 12.4 11.5 - 15.5 %   Platelets 302 150 - 400 K/uL  Comprehensive metabolic panel     Status: Abnormal   Collection Time: 05/17/15  3:44 AM  Result Value Ref Range   Sodium 142 135 - 145 mmol/L   Potassium 4.1 3.5 - 5.1 mmol/L   Chloride 108 101 - 111 mmol/L   CO2 22 22 - 32 mmol/L   Glucose, Bld 121 (H) 65 - 99 mg/dL   BUN 9 6 - 20 mg/dL   Creatinine, Ser 0.67 0.44 - 1.00 mg/dL   Calcium 9.2 8.9 - 10.3 mg/dL   Total Protein 6.6 6.5 - 8.1 g/dL   Albumin 3.9 3.5 - 5.0 g/dL   AST 22 15 - 41 U/L   ALT 15 14 - 54 U/L   Alkaline Phosphatase 60 38 - 126 U/L   Total Bilirubin <0.1 (L) 0.3 - 1.2 mg/dL   GFR calc non Af Amer >60 >60 mL/min   GFR calc Af Amer >60 >60 mL/min    Comment: (NOTE) The eGFR has been calculated using the CKD EPI equation. This calculation has not been validated in all clinical situations. eGFR's persistently <60 mL/min signify possible Chronic Kidney Disease.    Anion gap 12 5 - 15  Lipase, blood     Status: None   Collection Time: 05/17/15  3:44 AM  Result Value Ref Range   Lipase 25 11 - 51 U/L  I-stat troponin, ED (not at [facility], [facility])     Status: None   Collection Time: 05/17/15  3:50 AM  Result Value Ref Range   Troponin i, poc 0.00 0.00 - 0.08 ng/mL   Comment 3            Comment: Due to the release kinetics of cTnI, a negative result within the first hours of the onset of symptoms does not rule out myocardial infarction with certainty. If myocardial infarction is still suspected, repeat the test  at appropriate intervals.     Dg Chest 2  View  05/17/2015  CLINICAL DATA:  Chronic lower chest pain.  Initial encounter. EXAM: CHEST  2 VIEW COMPARISON:  None. FINDINGS: The lungs are well-aerated. There is no evidence of focal opacification, pleural effusion or pneumothorax. The heart is normal in size; the mediastinal contour is within normal limits. No acute osseous abnormalities are seen. Right convex thoracic scoliosis is noted. IMPRESSION: 1. No acute cardiopulmonary process seen. 2. Right convex thoracic scoliosis noted. Electronically Signed   By: Garald Balding M.D.   On: 05/17/2015 03:53   US Abdomen Limited Ruq  05/17/2015  CLINICAL DATA:  Right upper quadrant pain since last night. EXAM: US ABDOMEN LIMITED - RIGHT UPPER QUADRANT COMPARISON:  Abdominal ultrasound 06/17/2010 FINDINGS: Gallbladder: Multiple mobile gallstones are present measuring up to 7 mm in size. The gallbladder wall is moderately thickened, measuring 7 mm though some of this may be due to mild underdistention. No pericholecystic fluid was identified. Negative sonographic Murphy sign. Common bile duct: Diameter: 4 mm Liver: No focal lesion identified. Within normal limits in parenchymal echogenicity. IMPRESSION: 1. Multiple mobile gallstones with moderate gallbladder wall thickening. No sonographic Murphy sign, however acute cholecystitis is a concern in the appropriate clinical setting. 2. No biliary dilatation. Electronically Signed   By: Logan Bores M.D.   On: 05/17/2015 10:45    Review of Systems  Constitutional: Negative.   Eyes: Negative.   Respiratory: Negative.   Cardiovascular: Negative.   Gastrointestinal: Positive for abdominal pain and constipation. Negative for heartburn, nausea, vomiting, diarrhea, blood in stool and melena.  Genitourinary: Negative.   Musculoskeletal:       She says she was diagnosed with arthritis but is off medicine now.  Skin: Negative.   Neurological: Positive for headaches.       Hx of syncope, being seen by Dr. Jannifer Franklin.   Some history of brain injury/headaches.  It is hard to get a very detailed history.    Endo/Heme/Allergies: Negative.   Psychiatric/Behavioral: Positive for depression. Negative for suicidal ideas. The patient is nervous/anxious.        Seeing [place] psychiatrist for multiple issues at [facility].   Blood pressure 98/72, pulse 52, temperature 98.3 F (36.8 C), temperature source Oral, resp. rate 18, height '5\' 4"'  (1.626 m), weight 57.607 kg (127 lb), last menstrual period 05/06/2015, SpO2 99 %. Physical Exam  Assessment/Plan: Abdominal pain mostly RUQ Cholelithiasis Bipolar disorder on Lithium  Anxiety/panic attacks Depression Headaches/near syncope with abnormal MRI - followed by Dr. Margette Fast Hx of ETOH use, on going tobacco use Insomnia   Plan:  She remains hypotensive, she is off her Lithium, her QTC is abnormal.  She needs medical clearance and resolution of her hypotension before any procedure.  I will recheck her labs and HIDA scan tomorrow.  We will follow with you.   Caretha Rumbaugh 05/17/2015, 11:31 AM

## 2015-05-17 NOTE — ED Provider Notes (Signed)
CSN: [id number]     Arrival date & time 05/17/15  4098 History   First MD Initiated Contact with Patient 05/17/15 [phone]     Chief Complaint  Patient presents with  . Chest Pain  . Abdominal Pain  . Back Pain     (Consider location/radiation/quality/duration/timing/severity/associated sxs/prior Treatment) HPI Comments: Patient presents today with complaints of upper abdominal pain.  Pain located both RUQ and LUQ, but worse of the RUQ.  She states that the pain has been intermittent over the past few months, worse last evening.  She describes it as a stabbing, sharp pain.  She has not noticed an association with food.  Pain last anywhere from a few minutes to several hours.  She has not taken anything for pain prior to arrival.  She denies nausea, vomiting, fever, chills, cough, SOB, dizziness, or syncope.  She denies any prior history of known Gallstones.  She states that she has not drank any alcohol for the past 3 months.  The history is provided by the patient.    Past Medical History  Diagnosis Date  . Scoliosis   . Arthritis   . Anxiety   . Panic   . Depression   . Near syncope 02/03/2015  . Headache 04/20/2015  . Bipolar 1 disorder [facility])    Past Surgical History  Procedure Laterality Date  . Fracture surgery    . Tubal ligation     Family History  Problem Relation Age of Onset  . Adopted: Yes  . Cancer Other   . Diabetes Other    Social History  Substance Use Topics  . Smoking status: Current Every Day Smoker -- 0.40 packs/day for 0 years    Types: Cigarettes  . Smokeless tobacco: Never Used  . Alcohol Use: No   OB History    No data available     Review of Systems  All other systems reviewed and are negative.     Allergies  Other  Home Medications   Prior to Admission medications   Medication Sig Start Date End Date Taking? Authorizing Provider  clonazePAM (KLONOPIN) 0.5 MG tablet Take 1 tablet (0.5 mg total) by mouth 2 (two) times daily. 05/12/15    Cleotis Nipper, MD  FLUoxetine (PROZAC) 20 MG capsule Take 1 capsule (20 mg total) by mouth daily. 05/12/15 05/11/16  Cleotis Nipper, MD  lithium carbonate (LITHOBID) 300 MG CR tablet Take 1 tablet (300 mg total) by mouth 2 (two) times daily. 05/12/15   Cleotis Nipper, MD  tiZANidine (ZANAFLEX) 2 MG tablet Take 1 tablet (2 mg total) by mouth 3 (three) times daily. 04/20/15   York Spaniel, MD   BP 100/70 mmHg  Pulse 74  Temp(Src) 98.3 F (36.8 C) (Oral)  Resp 20  Ht  (1.626 m)  Wt 57.607 kg  BMI 21.79 kg/m2  SpO2 99%  LMP 05/06/2015 (Approximate) Physical Exam  Constitutional: She appears well-developed and well-nourished.  HENT:  Head: Normocephalic and atraumatic.  Mouth/Throat: Oropharynx is clear and moist.  Neck: Normal range of motion. Neck supple.  Cardiovascular: Normal rate, regular rhythm and normal heart sounds.   Pulmonary/Chest: Effort normal and breath sounds normal.  Abdominal: Soft. Bowel sounds are normal. She exhibits no distension and no mass. There is tenderness in the right upper quadrant. There is positive Murphy's sign. There is no rebound and no guarding.  Musculoskeletal: Normal range of motion.  Neurological: She is alert.  Skin: Skin is warm and dry.  Psychiatric: She has a normal mood and affect.  Nursing note and vitals reviewed.   ED Course  Procedures (including critical care time) Labs Review Labs Reviewed  BASIC METABOLIC PANEL - Abnormal; Notable for the following:    CO2 20 (*)    Glucose, Bld 124 (*)    All other components within normal limits  CBC  I-STAT TROPOININ, ED    Imaging Review Dg Chest 2 View  05/17/2015  CLINICAL DATA:  Chronic lower chest pain.  Initial encounter. EXAM: CHEST  2 VIEW COMPARISON:  None. FINDINGS: The lungs are well-aerated. There is no evidence of focal opacification, pleural effusion or pneumothorax. The heart is normal in size; the mediastinal contour is within normal limits. No acute osseous  abnormalities are seen. Right convex thoracic scoliosis is noted. IMPRESSION: 1. No acute cardiopulmonary process seen. 2. Right convex thoracic scoliosis noted. Electronically Signed   By: Roanna Raider M.D.   On: 05/17/2015 03:53   US Abdomen Limited Ruq  05/17/2015  CLINICAL DATA:  Right upper quadrant pain since last night. EXAM: US ABDOMEN LIMITED - RIGHT UPPER QUADRANT COMPARISON:  Abdominal ultrasound 06/17/2010 FINDINGS: Gallbladder: Multiple mobile gallstones are present measuring up to 7 mm in size. The gallbladder wall is moderately thickened, measuring 7 mm though some of this may be due to mild underdistention. No pericholecystic fluid was identified. Negative sonographic Murphy sign. Common bile duct: Diameter: 4 mm Liver: No focal lesion identified. Within normal limits in parenchymal echogenicity. IMPRESSION: 1. Multiple mobile gallstones with moderate gallbladder wall thickening. No sonographic Murphy sign, however acute cholecystitis is a concern in the appropriate clinical setting. 2. No biliary dilatation. Electronically Signed   By: Sebastian Ache M.D.   On: 05/17/2015 10:45   I have personally reviewed and evaluated these images and lab results as part of my medical decision-making.   EKG Interpretation   Date/Time:  Monday May 17 2015 03:36:53 EST Ventricular Rate:  74 PR Interval:  164 QRS Duration: 78 QT Interval:  456 QTC Calculation: 506 R Axis:   56 Text Interpretation:  Normal sinus rhythm Nonspecific T wave abnormality  Abnormal ECG Confirmed by ALLEN  MD, ANTHONY ([id number]) on 05/17/2015 11:01:47  AM      MDM   Final diagnoses:  None   Patient presents today with intermittent RUQ abdominal pain that has been present for the past few months, but worsened yesterday.  On exam, she does have pain localized to RUQ.  Labs unremarkable.  Abdominal ultrasound showing multiple mobile gallstones with moderate gallbladder wall thickening.   Consulted General Surgery  who recommended medicine admit for HIDA scan.  Family Medicine Service agreed to admit the patient.    Santiago Glad, PA-C 05/17/15 1535  Lorre Nick, MD 05/19/15 [phone]

## 2015-05-18 ENCOUNTER — Inpatient Hospital Stay (HOSPITAL_COMMUNITY): Payer: Medicare Other

## 2015-05-18 DIAGNOSIS — R1084 Generalized abdominal pain: Secondary | ICD-10-CM | POA: Insufficient documentation

## 2015-05-18 DIAGNOSIS — K8020 Calculus of gallbladder without cholecystitis without obstruction: Secondary | ICD-10-CM | POA: Diagnosis not present

## 2015-05-18 DIAGNOSIS — K8010 Calculus of gallbladder with chronic cholecystitis without obstruction: Secondary | ICD-10-CM | POA: Diagnosis not present

## 2015-05-18 LAB — COMPREHENSIVE METABOLIC PANEL
ALBUMIN: 3.4 g/dL — AB (ref 3.5–5.0)
ALK PHOS: 63 U/L (ref 38–126)
ALT: 14 U/L (ref 14–54)
AST: 18 U/L (ref 15–41)
Anion gap: 7 (ref 5–15)
BILIRUBIN TOTAL: 0.3 mg/dL (ref 0.3–1.2)
CO2: 23 mmol/L (ref 22–32)
Calcium: 8.5 mg/dL — ABNORMAL LOW (ref 8.9–10.3)
Chloride: 112 mmol/L — ABNORMAL HIGH (ref 101–111)
Creatinine, Ser: 0.48 mg/dL (ref 0.44–1.00)
GFR calc Af Amer: >60 mL/min (ref >60)
GFR calc non Af Amer: >60 mL/min (ref >60)
GLUCOSE: 77 mg/dL (ref 65–99)
POTASSIUM: 3.9 mmol/L (ref 3.5–5.1)
Sodium: 142 mmol/L (ref 135–145)
TOTAL PROTEIN: 6.3 g/dL — AB (ref 6.5–8.1)

## 2015-05-18 LAB — CBC
HEMATOCRIT: 40.2 % (ref 36.0–46.0)
HEMOGLOBIN: 13.3 g/dL (ref 12.0–15.0)
MCH: 29.7 pg (ref 26.0–34.0)
MCHC: 33.1 g/dL (ref 30.0–36.0)
MCV: 89.7 fL (ref 78.0–100.0)
Platelets: 286 10*3/uL (ref 150–400)
RBC: 4.48 MIL/uL (ref 3.87–5.11)
RDW: 12.5 % (ref 11.5–15.5)
WBC: 7.2 10*3/uL (ref 4.0–10.5)

## 2015-05-18 LAB — C DIFFICILE QUICK SCREEN W PCR REFLEX
C DIFFICILE (CDIFF) INTERP: NEGATIVE
C Diff antigen: NEGATIVE
C Diff toxin: NEGATIVE

## 2015-05-18 MED ORDER — SINCALIDE 5 MCG IJ SOLR
0.0200 ug/kg | Freq: Once | INTRAMUSCULAR | Status: AC
  Administered 2015-05-18: 1.15 ug via INTRAVENOUS

## 2015-05-18 MED ORDER — STERILE WATER FOR INJECTION IJ SOLN
INTRAMUSCULAR | Status: AC
  Administered 2015-05-18: 5 mL via INTRAMUSCULAR
  Filled 2015-05-18: qty 10

## 2015-05-18 MED ORDER — CLONAZEPAM 0.5 MG PO TABS
0.5000 mg | ORAL_TABLET | Freq: Two times a day (BID) | ORAL | Status: DC
  Administered 2015-05-18 – 2015-05-21 (×6): 0.5 mg via ORAL
  Filled 2015-05-18 (×6): qty 1

## 2015-05-18 MED ORDER — SINCALIDE 5 MCG IJ SOLR
INTRAMUSCULAR | Status: AC
  Administered 2015-05-18: 1.15 ug via INTRAVENOUS
  Filled 2015-05-18: qty 5

## 2015-05-18 MED ORDER — TECHNETIUM TC 99M MEBROFENIN IV KIT
5.2000 | PACK | Freq: Once | INTRAVENOUS | Status: AC | PRN
  Administered 2015-05-18: 5 via INTRAVENOUS

## 2015-05-18 MED ORDER — NICOTINE 14 MG/24HR TD PT24
14.0000 mg | MEDICATED_PATCH | TRANSDERMAL | Status: DC
  Administered 2015-05-18 – 2015-05-20 (×3): 14 mg via TRANSDERMAL
  Filled 2015-05-18 (×3): qty 1

## 2015-05-18 MED ORDER — STERILE WATER FOR INJECTION IJ SOLN
10.0000 mL | Freq: Once | INTRAMUSCULAR | Status: AC
  Administered 2015-05-18: 5 mL via INTRAMUSCULAR
  Filled 2015-05-18: qty 10

## 2015-05-18 NOTE — Progress Notes (Signed)
Family Medicine Teaching Service Daily Progress Note Intern Pager: [phone]  Patient name: Kimberly Lynch Medical record number: [id number] Date of birth: 02/07/1986 Age: 30 y.o. Gender: female  Primary Care Provider: No Pcp Consultants: Surgery  Code Status: Full  Pt Overview and Major Events to Date:   Assessment and Plan: Kimberly Lynch is a 30 y.o. female presenting with right upper quadrant abdominal pain . PMH is significant for anxiety and bipolar 1 disorder.  Upper quadrant abdominal pain: Still with RUQ abdominal pain. History of intermittent abdominal pain over the course of a few months. Acute onset of constant pain last night around 3 AM, remitting at 7 AM this morning. Patient was afebrile on admission, with white blood cell count of 9.6. Right upper quadrant ultrasound was performed to begin for multiple gallstones with moderate gallbladder wall thickening concerning for acute cholecystitis. Differential to include acute cholecystitis versus pancreatitis versus gastric ulcer. However likely cholecystitis per imaging and location of pain.  - Lipase negative  - Surgery consulted, HIDA scan with 32% flow, normal about 40%; surgery state no acute cholecystitis and are signing off  - Pelvic exam, pregnancy test, G/C testing  - Vitals per floor protocol   Bipolar 1 disorder/Anxiety: Takes lithium (last dose 2 days ago) and Prozac. UDS negative  - Will continue these medications  FEN/GI: Diet Regular Prophylaxis: Lovenox   Disposition: Med-surg   Subjective:  Patient just had a large bowel movement. She states she is still having upper quadrant pain and she is worried the pain is going to worsen if she goes home. No fever, chills, vomiting, SOB, or chest pain. She states she does have some nausea at times    Objective: Temp:  [97.8 F (36.6 C)-98.6 F (37 C)] 98.6 F (37 C) (02/21 0535) Pulse Rate:  [52-68] 58 (02/21 0535) Resp:  [14-18] 16 (02/21 0535) BP:  (84-109)/(52-78) 96/57 mmHg (02/21 0535) SpO2:  [94 %-100 %] 100 % (02/20 2201) Physical Exam: General: lying in bed, NAD Cardiovascular: RRR, no murmurs, rubs or gallops Respiratory: CTAB, no wheezes or rhonchi  Abdomen: BS+, RUQ ttp, no rebound or guarding  Extremities: No lower extremity edema   Laboratory:  Recent Labs Lab 05/17/15 0344  WBC 9.6  HGB 14.4  HCT 44.0  PLT 302    Recent Labs Lab 05/17/15 0344  NA 142  141  K 4.1  3.8  CL 108  108  CO2 22  20*  BUN 9  8  CREATININE 0.67  0.65  CALCIUM 9.2  9.2  PROT 6.6  BILITOT <0.1*  ALKPHOS 60  ALT 15  AST 22  GLUCOSE 121*  124*     Imaging/Diagnostic Tests: Dg Chest 2 View  05/17/2015  CLINICAL DATA:  Chronic lower chest pain.  Initial encounter. EXAM: CHEST  2 VIEW COMPARISON:  None. FINDINGS: The lungs are well-aerated. There is no evidence of focal opacification, pleural effusion or pneumothorax. The heart is normal in size; the mediastinal contour is within normal limits. No acute osseous abnormalities are seen. Right convex thoracic scoliosis is noted. IMPRESSION: 1. No acute cardiopulmonary process seen. 2. Right convex thoracic scoliosis noted. Electronically Signed   By: Roanna Raider M.D.   On: 05/17/2015 03:53   Nm Hepato W/eject Fract  05/18/2015  CLINICAL DATA:  Right upper quadrant pain starting 1 month ago EXAM: NUCLEAR MEDICINE HEPATOBILIARY IMAGING WITH GALLBLADDER EF TECHNIQUE: Sequential images of the abdomen were obtained out to 60 minutes following intravenous administration of  radiopharmaceutical. After slow intravenous infusion of 1.15 micrograms Cholecystokinin, gallbladder ejection fraction was determined. RADIOPHARMACEUTICALS:  5.2 mCi Tc-14m Choletec IV COMPARISON:  Gallbladder ultrasound 05/17/2015 FINDINGS: There is normal uptake of the tracer by the liver. Gallbladder visualized at 15 minutes. CBD visualized at 30 minutes. Bowel activity noted at 30 minutes. Post CCK gallbladder  ejection fraction is 32%. At 60 min, normal ejection fraction is greater than 40%. IMPRESSION: No cystic duct obstruction. No CBD obstruction. Post CCK gallbladder ejection fraction is 32%. At 60 minutes normal gallbladder ejection fraction should be greater than 40%. Electronically Signed   By: Natasha Mead M.D.   On: 05/18/2015 09:51   US Abdomen Limited Ruq  05/17/2015  CLINICAL DATA:  Right upper quadrant pain since last night. EXAM: US ABDOMEN LIMITED - RIGHT UPPER QUADRANT COMPARISON:  Abdominal ultrasound 06/17/2010 FINDINGS: Gallbladder: Multiple mobile gallstones are present measuring up to 7 mm in size. The gallbladder wall is moderately thickened, measuring 7 mm though some of this may be due to mild underdistention. No pericholecystic fluid was identified. Negative sonographic Murphy sign. Common bile duct: Diameter: 4 mm Liver: No focal lesion identified. Within normal limits in parenchymal echogenicity. IMPRESSION: 1. Multiple mobile gallstones with moderate gallbladder wall thickening. No sonographic Murphy sign, however acute cholecystitis is a concern in the appropriate clinical setting. 2. No biliary dilatation. Electronically Signed   By: Sebastian Ache M.D.   On: 05/17/2015 10:45     Kimberly Mayra Reel, MD 05/18/2015, 8:10 AM PGY-1, [facility] Family Medicine FPTS Intern pager: [phone], text pages welcome

## 2015-05-18 NOTE — Progress Notes (Signed)
Nutrition Brief Note  Patient identified on the Malnutrition Screening Tool (MST) Report  Wt Readings from Last 15 Encounters:  05/17/15 127 lb (57.607 kg)  05/12/15 122 lb 6.4 oz (55.52 kg)  04/20/15 131 lb (59.421 kg)  03/31/15 125 lb 3.2 oz (56.79 kg)  02/03/15 127 lb (57.607 kg)  01/26/15 128 lb 6.4 oz (58.242 kg)  11/24/14 134 lb 6.4 oz (60.963 kg)  10/23/14 134 lb 3.2 oz (60.873 kg)  07/20/14 130 lb 6.4 oz (59.149 kg)  05/04/14 136 lb 6.4 oz (61.871 kg)  03/02/14 136 lb 6.4 oz (61.871 kg)  01/12/14 141 lb (63.957 kg)  11/10/13 145 lb 3.2 oz (65.862 kg)  10/06/13 150 lb 3.2 oz (68.13 kg)  08/20/13 138 lb 3.2 oz (62.687 kg)   30 yo F presenting with RUQ pain, with evidence of possible cholecystitis on abdominal ultrasound. Surgery team has consulted and would like HIDA scan prior to deciding about whether to proceed with cholecystectomy.  Case discussed with RN. Pt underwent HIDA scan earlier today, which did not show cholecystitis.   Pt lying in bed at time of visit. She complains of abdominal pain. Per RN report, pt consumed about 50% of her lunch a cheeseburger brought in from home. Pt confirms weight loss and poor appetite a few days PTA due to abdominal pain. She reveals that she was trying to lose weight in the long term and "feels comfortable" at her current weight.   Nutrition-Focused physical exam completed. Findings are no fat depletion, no muscle depletion, and noedema.   Body mass index is 21.79 kg/(m^2). Patient meets criteria for normal weight range based on current BMI.   Current diet order is Heart Healthy, patient is consuming approximately 50% of meals at this time. Labs and medications reviewed.   No nutrition interventions warranted at this time. If nutrition issues arise, please consult RD.   Airabella Barley A. Mayford Knife, RD, LDN, CDE Pager: [phone] After hours Pager: [phone]

## 2015-05-18 NOTE — Progress Notes (Signed)
Subjective: She feels better this AM, HIDA is negative except for ejection fraction of 32 % down from normal of 40%.  Labs are still pending.  Objective: Vital signs in last 24 hours: Temp:  [97.8 F (36.6 C)-98.6 F (37 C)] 98.6 F (37 C) (02/21 0535) Pulse Rate:  [52-68] 58 (02/21 0535) Resp:  [14-18] 16 (02/21 0535) BP: (84-109)/(52-78) 96/57 mmHg (02/21 0535) SpO2:  [94 %-100 %] 100 % (02/20 2201) Last BM Date: 05/17/15 Afebrile, VSS Labs are still pending HIDA:  No cystic duct obstruction. No CBD obstruction. Post CCK gallbladder ejection fraction is 32%. At 60 minutes normal gallbladder ejection fraction should be greater than 40%.   Intake/Output from previous day:   Intake/Output this shift:    General appearance: alert, cooperative and no distress GI: soft, she feels better still a little tender over the RUQ.  Lab Results:   Recent Labs  05/17/15 0344  WBC 9.6  HGB 14.4  HCT 44.0  PLT 302    BMET  Recent Labs  05/17/15 0344  NA 142  141  K 4.1  3.8  CL 108  108  CO2 22  20*  GLUCOSE 121*  124*  BUN 9  8  CREATININE 0.67  0.65  CALCIUM 9.2  9.2   PT/INR No results for input(s): LABPROT, INR in the last 72 hours.   Recent Labs Lab 05/17/15 0344  AST 22  ALT 15  ALKPHOS 60  BILITOT <0.1*  PROT 6.6  ALBUMIN 3.9     Lipase     Component Value Date/Time   LIPASE 20 05/17/2015 1704     Studies/Results: Dg Chest 2 View  05/17/2015  CLINICAL DATA:  Chronic lower chest pain.  Initial encounter. EXAM: CHEST  2 VIEW COMPARISON:  None. FINDINGS: The lungs are well-aerated. There is no evidence of focal opacification, pleural effusion or pneumothorax. The heart is normal in size; the mediastinal contour is within normal limits. No acute osseous abnormalities are seen. Right convex thoracic scoliosis is noted. IMPRESSION: 1. No acute cardiopulmonary process seen. 2. Right convex thoracic scoliosis noted. Electronically Signed   By:  Roanna Raider M.D.   On: 05/17/2015 03:53   Nm Hepato W/eject Fract  05/18/2015  CLINICAL DATA:  Right upper quadrant pain starting 1 month ago EXAM: NUCLEAR MEDICINE HEPATOBILIARY IMAGING WITH GALLBLADDER EF TECHNIQUE: Sequential images of the abdomen were obtained out to 60 minutes following intravenous administration of radiopharmaceutical. After slow intravenous infusion of 1.15 micrograms Cholecystokinin, gallbladder ejection fraction was determined. RADIOPHARMACEUTICALS:  5.2 mCi Tc-58m Choletec IV COMPARISON:  Gallbladder ultrasound 05/17/2015 FINDINGS: There is normal uptake of the tracer by the liver. Gallbladder visualized at 15 minutes. CBD visualized at 30 minutes. Bowel activity noted at 30 minutes. Post CCK gallbladder ejection fraction is 32%. At 60 min, normal ejection fraction is greater than 40%. IMPRESSION: No cystic duct obstruction. No CBD obstruction. Post CCK gallbladder ejection fraction is 32%. At 60 minutes normal gallbladder ejection fraction should be greater than 40%. Electronically Signed   By: Natasha Mead M.D.   On: 05/18/2015 09:51   US Abdomen Limited Ruq  05/17/2015  CLINICAL DATA:  Right upper quadrant pain since last night. EXAM: US ABDOMEN LIMITED - RIGHT UPPER QUADRANT COMPARISON:  Abdominal ultrasound 06/17/2010 FINDINGS: Gallbladder: Multiple mobile gallstones are present measuring up to 7 mm in size. The gallbladder wall is moderately thickened, measuring 7 mm though some of this may be due to mild underdistention. No pericholecystic fluid was  identified. Negative sonographic Murphy sign. Common bile duct: Diameter: 4 mm Liver: No focal lesion identified. Within normal limits in parenchymal echogenicity. IMPRESSION: 1. Multiple mobile gallstones with moderate gallbladder wall thickening. No sonographic Murphy sign, however acute cholecystitis is a concern in the appropriate clinical setting. 2. No biliary dilatation. Electronically Signed   By: Sebastian Ache M.D.   On:  05/17/2015 10:45    Medications: . enoxaparin (LOVENOX) injection  40 mg Subcutaneous Q24H  . FLUoxetine  20 mg Oral Daily  . lithium carbonate  300 mg Oral BID    Assessment/Plan Abdominal pain mostly RUQ Cholelithiasis Bipolar disorder on Lithium  Anxiety/panic attacks Depression Headaches/near syncope with abnormal MRI - followed by Dr. Stephanie Acre Hx of ETOH use, on going tobacco use Insomnia     Plan:  I have discussed this with Dr. Janee Morn.  It is his opinion that if she can tolerate diet she could go home and come back to see [place] later for biliary dyskinesia if she would like to pursue further.       LOS: 1 day    Jemeka Wagler 05/18/2015

## 2015-05-19 DIAGNOSIS — K828 Other specified diseases of gallbladder: Secondary | ICD-10-CM | POA: Insufficient documentation

## 2015-05-19 LAB — LIPASE, BLOOD: Lipase: 21 U/L (ref 11–51)

## 2015-05-19 LAB — CBC
HCT: 41.9 % (ref 36.0–46.0)
HEMOGLOBIN: 13.9 g/dL (ref 12.0–15.0)
MCH: 30.3 pg (ref 26.0–34.0)
MCHC: 33.2 g/dL (ref 30.0–36.0)
MCV: 91.3 fL (ref 78.0–100.0)
Platelets: 246 10*3/uL (ref 150–400)
RBC: 4.59 MIL/uL (ref 3.87–5.11)
RDW: 12.4 % (ref 11.5–15.5)
WBC: 7.8 10*3/uL (ref 4.0–10.5)

## 2015-05-19 LAB — HEPATIC FUNCTION PANEL
ALK PHOS: 60 U/L (ref 38–126)
ALT: 13 U/L — AB (ref 14–54)
AST: 16 U/L (ref 15–41)
Albumin: 3.5 g/dL (ref 3.5–5.0)
BILIRUBIN TOTAL: 0.2 mg/dL — AB (ref 0.3–1.2)
Total Protein: 6 g/dL — ABNORMAL LOW (ref 6.5–8.1)

## 2015-05-19 LAB — BASIC METABOLIC PANEL
Anion gap: 13 (ref 5–15)
CHLORIDE: 105 mmol/L (ref 101–111)
CO2: 25 mmol/L (ref 22–32)
CREATININE: 0.64 mg/dL (ref 0.44–1.00)
Calcium: 9.1 mg/dL (ref 8.9–10.3)
GFR calc Af Amer: >60 mL/min (ref >60)
GFR calc non Af Amer: >60 mL/min (ref >60)
GLUCOSE: 76 mg/dL (ref 65–99)
Potassium: 3.6 mmol/L (ref 3.5–5.1)
SODIUM: 143 mmol/L (ref 135–145)

## 2015-05-19 LAB — SURGICAL PCR SCREEN
MRSA, PCR: NEGATIVE
Staphylococcus aureus: NEGATIVE

## 2015-05-19 LAB — HEMOGLOBIN A1C
Hgb A1c MFr Bld: 5.3 % (ref 4.8–5.6)
Mean Plasma Glucose: 105 mg/dL

## 2015-05-19 LAB — PREGNANCY, URINE: PREG TEST UR: NEGATIVE

## 2015-05-19 MED ORDER — OXYCODONE HCL 5 MG PO TABS
5.0000 mg | ORAL_TABLET | Freq: Four times a day (QID) | ORAL | Status: DC | PRN
  Administered 2015-05-19: 5 mg via ORAL
  Filled 2015-05-19: qty 1

## 2015-05-19 MED ORDER — IBUPROFEN 600 MG PO TABS
600.0000 mg | ORAL_TABLET | Freq: Four times a day (QID) | ORAL | Status: DC | PRN
  Administered 2015-05-19: 600 mg via ORAL
  Filled 2015-05-19: qty 1

## 2015-05-19 MED ORDER — CEFAZOLIN SODIUM-DEXTROSE 2-3 GM-% IV SOLR
2.0000 g | INTRAVENOUS | Status: AC
  Administered 2015-05-20: 2 g via INTRAVENOUS
  Filled 2015-05-19 (×2): qty 50

## 2015-05-19 NOTE — Discharge Summary (Signed)
Family Medicine Teaching [facility] Discharge Summary  Patient name: Kimberly Lynch Medical record number: [id number] Date of birth: April 19, 1985 Age: 30 y.o. Gender: female Date of Admission: 05/17/2015  Date of Discharge: 05/21/2015  Admitting Physician: Latrelle Dodrill, MD  Primary Care Provider: No Pcp Consultants: Surgery  Indication for Hospitalization: RUQ pain   Discharge Diagnoses/Problem List:  Bipolar Disorder   Disposition: Home   Discharge Condition: Stable    Discharge Exam:  General: lying in bed, NAD Cardiovascular: RRR, no murmurs, rubs or gallops Respiratory: CTAB, no wheezes or rhonchi  Abdomen: BS+, tenderness throughout, no rebound or guarding  Extremities: SCDS in place   Brief Hospital Course:  Patient admitted for right upper quadrant pain. Right upper quadrant ultrasound was positive for multiple stones and bladder wall thickening. Surgery evaluated patient and indicated that they would get a HIDA scan did HIDA scan showed decreased gallbladder ejection fraction. Patient continued to have pain and right upper quadrant and surgery decided to proceed with a cholecystectomy. On this this surgical procedure note patient was noted to have chronic cholecystitis. Patient was able to eat, was ambulating, and was provided with oral pain control.   Issues for Follow Up:  1. Pain control given with Norco 5 mg every 6 hours when necessary,  2. Follow up on patient's pain 3. Follow-up on patient seen surgery for postop cholecystectomy 4. Provided miralax as needed for constipation   Significant Procedures:   Nm Hepato W/eject Fract  05/18/2015  CLINICAL DATA:  Right upper quadrant pain starting 1 month ago EXAM: NUCLEAR MEDICINE HEPATOBILIARY IMAGING WITH GALLBLADDER EF TECHNIQUE: Sequential images of the abdomen were obtained out to 60 minutes following intravenous administration of radiopharmaceutical. After slow intravenous infusion of 1.15 micrograms  Cholecystokinin, gallbladder ejection fraction was determined. RADIOPHARMACEUTICALS:  5.2 mCi Tc-72m Choletec IV COMPARISON:  Gallbladder ultrasound 05/17/2015 FINDINGS: There is normal uptake of the tracer by the liver. Gallbladder visualized at 15 minutes. CBD visualized at 30 minutes. Bowel activity noted at 30 minutes. Post CCK gallbladder ejection fraction is 32%. At 60 min, normal ejection fraction is greater than 40%. IMPRESSION: No cystic duct obstruction. No CBD obstruction. Post CCK gallbladder ejection fraction is 32%. At 60 minutes normal gallbladder ejection fraction should be greater than 40%. Electronically Signed   By: Natasha Mead M.D.   On: 05/18/2015 09:51    Significant Labs and Imaging:   Recent Labs Lab 05/18/15 0947 05/19/15 0627 05/20/15 0535  WBC 7.2 7.8 8.8  HGB 13.3 13.9 13.7  HCT 40.2 41.9 41.4  PLT 286 246 259    Recent Labs Lab 05/17/15 0344 05/18/15 0947 05/19/15 0627 05/20/15 0535  NA 142  141 142 143 140  K 4.1  3.8 3.9 3.6 3.4*  CL 108  108 112* 105 106  CO2 22  20* GLUCOSE 121*  124* 77 76 84  BUN 9  8 <5* <5* 6  CREATININE 0.67  0.65 0.48 0.64 0.70  CALCIUM 9.2  9.2 8.5* 9.1 9.0  ALKPHOS 60 63 60 59  AST ALT 15 14 13* 15  ALBUMIN 3.9 3.4* 3.5 3.5    Results/Tests Pending at Time of Discharge: None   Discharge Medications:    Medication List    ASK your doctor about these medications        clonazePAM 0.5 MG tablet  Commonly known as:  KLONOPIN  Take 1 tablet (0.5 mg total) by mouth 2 (two)  times daily.     FLUoxetine 20 MG capsule  Commonly known as:  PROZAC  Take 1 capsule (20 mg total) by mouth daily.     lithium carbonate 300 MG CR tablet  Commonly known as:  LITHOBID  Take 1 tablet (300 mg total) by mouth 2 (two) times daily.     NYQUIL COLD & FLU PO  Take 1 capsule by mouth at bedtime as needed (sleep).     tiZANidine 2 MG tablet  Commonly known as:  ZANAFLEX  Take 1 tablet (2 mg total)  by mouth 3 (three) times daily.        Discharge Instructions: Please refer to Patient Instructions section of EMR for full details.  Patient was counseled important signs and symptoms that should prompt return to medical care, changes in medications, dietary instructions, activity restrictions, and follow up appointments.   Follow-Up Appointments: Follow-up Information    Follow up with [facility] On 06/09/2015.   Specialty:  General Surgery   Why:  Your appointment is at 11:15 AM, be at the office 30 minutes early for check in.   Contact information:   [address] [place] [address] [phone]       Berton Bon, MD 05/21/2015, 12:35 PM PGY-1, [facility]

## 2015-05-19 NOTE — Progress Notes (Signed)
Family Medicine Teaching Service Daily Progress Note Intern Pager: [phone]  Patient name: Kimberly Lynch Medical record number: [id number] Date of birth: 04-27-1985 Age: 30 y.o. Gender: female  Primary Care Provider: No Pcp Consultants: Surgery  Code Status: Full  Pt Overview and Major Events to Date:   Assessment and Plan: Kimberly Lynch is a 30 y.o. female presenting with right upper quadrant abdominal pain . PMH is significant for anxiety and bipolar 1 disorder.  Upper quadrant abdominal pain: Still with RUQ abdominal pain. History of intermittent abdominal pain over the course of a few months.  Patient was afebrile on admission, with white blood cell count of 9.6. Right upper quadrant ultrasound was performed to begin for multiple gallstones with moderate gallbladder wall thickening concerning for acute cholecystitis. Differential to include acute cholecystitis versus pancreatitis versus gastric ulcer. However likely cholecystitis per imaging and location of pain.  - Lipase negative  - Surgery consulted, HIDA scan with 32% flow, normal about 40%; surgery state no acute cholecystitis - Patient refused pelvic exam, she feels she does not have lower quadrant abdominal pain and does not want pelvic exam. Pregnancy test negative. No vaginal discharge.  - Vitals per floor protocol   Bipolar 1 disorder/Anxiety: Takes lithium (last dose 2 days ago) and Prozac. UDS negative  - Will continue these medications  FEN/GI: Diet Regular Prophylaxis: Lovenox   Disposition: Med-surg   Subjective:  Patient doing okay this morning. States she had 5/10 RUQ abdominal pain. No fever/chills, n/v.   Objective: Temp:  [97.4 F (36.3 C)-98.1 F (36.7 C)] 97.9 F (36.6 C) (02/22 0527) Pulse Rate:  [51-80] 51 (02/22 0527) Resp:  [14-16] 16 (02/22 0527) BP: (92-107)/(54-68) 92/54 mmHg (02/22 0527) SpO2:  [95 %-100 %] 100 % (02/22 0527) Physical Exam: General: lying in bed, NAD Cardiovascular:  RRR, no murmurs, rubs or gallops Respiratory: CTAB, no wheezes or rhonchi  Abdomen: BS+, RUQ ttp, no rebound or guarding  Extremities: No lower extremity edema   Laboratory:  Recent Labs Lab 05/17/15 0344 05/18/15 0947 05/19/15 0627  WBC 9.6 7.2 7.8  HGB 14.4 13.3 13.9  HCT 44.0 40.2 41.9  PLT 302 286 246    Recent Labs Lab 05/17/15 0344 05/18/15 0947 05/19/15 0627  NA 142  141 142 143  K 4.1  3.8 3.9 3.6  CL 108  108 112* 105  CO2 22  20* 23 25  BUN 9  8 <5* <5*  CREATININE 0.67  0.65 0.48 0.64  CALCIUM 9.2  9.2 8.5* 9.1  PROT 6.6 6.3*  --   BILITOT <0.1* 0.3  --   ALKPHOS 60 63  --   ALT 15 14  --   AST 22 18  --   GLUCOSE 121*  124* 77 76     Imaging/Diagnostic Tests: Nm Hepato W/eject Fract  05/18/2015  CLINICAL DATA:  Right upper quadrant pain starting 1 month ago EXAM: NUCLEAR MEDICINE HEPATOBILIARY IMAGING WITH GALLBLADDER EF TECHNIQUE: Sequential images of the abdomen were obtained out to 60 minutes following intravenous administration of radiopharmaceutical. After slow intravenous infusion of 1.15 micrograms Cholecystokinin, gallbladder ejection fraction was determined. RADIOPHARMACEUTICALS:  5.2 mCi Tc-29m Choletec IV COMPARISON:  Gallbladder ultrasound 05/17/2015 FINDINGS: There is normal uptake of the tracer by the liver. Gallbladder visualized at 15 minutes. CBD visualized at 30 minutes. Bowel activity noted at 30 minutes. Post CCK gallbladder ejection fraction is 32%. At 60 min, normal ejection fraction is greater than 40%. IMPRESSION: No cystic duct obstruction. No  CBD obstruction. Post CCK gallbladder ejection fraction is 32%. At 60 minutes normal gallbladder ejection fraction should be greater than 40%. Electronically Signed   By: Natasha Mead M.D.   On: 05/18/2015 09:51   US Abdomen Limited Ruq  05/17/2015  CLINICAL DATA:  Right upper quadrant pain since last night. EXAM: US ABDOMEN LIMITED - RIGHT UPPER QUADRANT COMPARISON:  Abdominal ultrasound  06/17/2010 FINDINGS: Gallbladder: Multiple mobile gallstones are present measuring up to 7 mm in size. The gallbladder wall is moderately thickened, measuring 7 mm though some of this may be due to mild underdistention. No pericholecystic fluid was identified. Negative sonographic Murphy sign. Common bile duct: Diameter: 4 mm Liver: No focal lesion identified. Within normal limits in parenchymal echogenicity. IMPRESSION: 1. Multiple mobile gallstones with moderate gallbladder wall thickening. No sonographic Murphy sign, however acute cholecystitis is a concern in the appropriate clinical setting. 2. No biliary dilatation. Electronically Signed   By: Sebastian Ache M.D.   On: 05/17/2015 10:45     Asiyah Mayra Reel, MD 05/19/2015, 9:57 AM PGY-1, [facility] FPTS Intern pager: [phone], text pages welcome

## 2015-05-19 NOTE — Progress Notes (Signed)
  Subjective: Still complaining of pain, only able to eat very little without recurrent pain.  She was sleeping very well when I came in but she continues to complain of pain with eating.  Objective: Vital signs in last 24 hours: Temp:  [97.4 F (36.3 C)-98.1 F (36.7 C)] 97.9 F (36.6 C) (02/22 0527) Pulse Rate:  [51-80] 51 (02/22 0527) Resp:  [14-16] 16 (02/22 0527) BP: (92-107)/(54-68) 92/54 mmHg (02/22 0527) SpO2:  [95 %-100 %] 100 % (02/22 0527) Last BM Date: 05/18/15 420 PO BM x 1  Afebrile, BP in the 90's - 100 range Labs remain normal   Intake/Output from previous day: 02/21 0701 - 02/22 0700 In: 420 [P.O.:420] Out: 300 [Urine:300] Intake/Output this shift:    General appearance: alert, cooperative and no distress GI: soft, complains of tenderness RUQ.  Lab Results:   Recent Labs  05/18/15 0947 05/19/15 0627  WBC 7.2 7.8  HGB 13.3 13.9  HCT 40.2 41.9  PLT 286 246    BMET  Recent Labs  05/18/15 0947 05/19/15 0627  NA 142 143  K 3.9 3.6  CL 112* 105  CO2 23 25  GLUCOSE 77 76  BUN <5* <5*  CREATININE 0.48 0.64  CALCIUM 8.5* 9.1   PT/INR No results for input(s): LABPROT, INR in the last 72 hours.   Recent Labs Lab 05/17/15 0344 05/18/15 0947  AST 22 18  ALT 15 14  ALKPHOS 60 63  BILITOT <0.1* 0.3  PROT 6.6 6.3*  ALBUMIN 3.9 3.4*     Lipase     Component Value Date/Time   LIPASE 20 05/17/2015 1704     Studies/Results: Nm Hepato W/eject Fract  05/18/2015  CLINICAL DATA:  Right upper quadrant pain starting 1 month ago EXAM: NUCLEAR MEDICINE HEPATOBILIARY IMAGING WITH GALLBLADDER EF TECHNIQUE: Sequential images of the abdomen were obtained out to 60 minutes following intravenous administration of radiopharmaceutical. After slow intravenous infusion of 1.15 micrograms Cholecystokinin, gallbladder ejection fraction was determined. RADIOPHARMACEUTICALS:  5.2 mCi Tc-51m Choletec IV COMPARISON:  Gallbladder ultrasound 05/17/2015 FINDINGS:  There is normal uptake of the tracer by the liver. Gallbladder visualized at 15 minutes. CBD visualized at 30 minutes. Bowel activity noted at 30 minutes. Post CCK gallbladder ejection fraction is 32%. At 60 min, normal ejection fraction is greater than 40%. IMPRESSION: No cystic duct obstruction. No CBD obstruction. Post CCK gallbladder ejection fraction is 32%. At 60 minutes normal gallbladder ejection fraction should be greater than 40%. Electronically Signed   By: Natasha Mead M.D.   On: 05/18/2015 09:51    Medications: . clonazePAM  0.5 mg Oral BID  . enoxaparin (LOVENOX) injection  40 mg Subcutaneous Q24H  . FLUoxetine  20 mg Oral Daily  . lithium carbonate  300 mg Oral BID  . nicotine  14 mg Transdermal Q24H    Assessment/Plan Abdominal pain mostly RUQ Cholelithiasis Bipolar disorder on Lithium  Anxiety/panic attacks Depression Headaches/near syncope with abnormal MRI - followed by Dr. Stephanie Acre Hx of ETOH use, on going tobacco use Insomnia  Antibiotics:  None DVT:  Lovenox   Plan:  She has eaten today, her labs remain normal.  i will discuss with Dr. Janee Morn.  We will make her NPO and offer her surgery tomorrow.  With presumed biliary dyskinesia, cholecystectomy does not always relieve symptoms.   LFT's and lipase are normal.    LOS: 2 days    Kimberly Lynch 05/19/2015

## 2015-05-20 ENCOUNTER — Ambulatory Visit (HOSPITAL_COMMUNITY): Payer: Self-pay | Admitting: Clinical

## 2015-05-20 ENCOUNTER — Inpatient Hospital Stay (HOSPITAL_COMMUNITY): Payer: Medicare Other | Admitting: Certified Registered Nurse Anesthetist

## 2015-05-20 ENCOUNTER — Encounter (HOSPITAL_COMMUNITY)
Admission: EM | Disposition: A | Discharge status: Home / Self Care | Payer: Self-pay | Source: Home / Self Care | Attending: Family Medicine

## 2015-05-20 ENCOUNTER — Encounter (HOSPITAL_COMMUNITY): Payer: Self-pay | Admitting: Certified Registered Nurse Anesthetist

## 2015-05-20 DIAGNOSIS — K8020 Calculus of gallbladder without cholecystitis without obstruction: Secondary | ICD-10-CM | POA: Diagnosis not present

## 2015-05-20 DIAGNOSIS — K8010 Calculus of gallbladder with chronic cholecystitis without obstruction: Principal | ICD-10-CM

## 2015-05-20 HISTORY — PX: CHOLECYSTECTOMY: SHX55

## 2015-05-20 LAB — COMPREHENSIVE METABOLIC PANEL
ALK PHOS: 59 U/L (ref 38–126)
ALT: 15 U/L (ref 14–54)
AST: 16 U/L (ref 15–41)
Albumin: 3.5 g/dL (ref 3.5–5.0)
Anion gap: 9 (ref 5–15)
BILIRUBIN TOTAL: 0.2 mg/dL — AB (ref 0.3–1.2)
BUN: 6 mg/dL (ref 6–20)
CALCIUM: 9 mg/dL (ref 8.9–10.3)
CHLORIDE: 106 mmol/L (ref 101–111)
CO2: 25 mmol/L (ref 22–32)
CREATININE: 0.7 mg/dL (ref 0.44–1.00)
GFR calc Af Amer: >60 mL/min (ref >60)
Glucose, Bld: 84 mg/dL (ref 65–99)
Potassium: 3.4 mmol/L — ABNORMAL LOW (ref 3.5–5.1)
Sodium: 140 mmol/L (ref 135–145)
TOTAL PROTEIN: 6.1 g/dL — AB (ref 6.5–8.1)

## 2015-05-20 LAB — CBC
HEMATOCRIT: 41.4 % (ref 36.0–46.0)
HEMOGLOBIN: 13.7 g/dL (ref 12.0–15.0)
MCH: 30.2 pg (ref 26.0–34.0)
MCHC: 33.1 g/dL (ref 30.0–36.0)
MCV: 91.2 fL (ref 78.0–100.0)
Platelets: 259 10*3/uL (ref 150–400)
RBC: 4.54 MIL/uL (ref 3.87–5.11)
RDW: 12.3 % (ref 11.5–15.5)
WBC: 8.8 10*3/uL (ref 4.0–10.5)

## 2015-05-20 SURGERY — LAPAROSCOPIC CHOLECYSTECTOMY WITH INTRAOPERATIVE CHOLANGIOGRAM
Anesthesia: General | Site: Abdomen

## 2015-05-20 MED ORDER — 0.9 % SODIUM CHLORIDE (POUR BTL) OPTIME
TOPICAL | Status: DC | PRN
  Administered 2015-05-20: 1000 mL

## 2015-05-20 MED ORDER — DIPHENHYDRAMINE HCL 50 MG/ML IJ SOLN
INTRAMUSCULAR | Status: DC | PRN
  Administered 2015-05-20: 12.5 mg via INTRAVENOUS

## 2015-05-20 MED ORDER — SUGAMMADEX SODIUM 500 MG/5ML IV SOLN
INTRAVENOUS | Status: DC | PRN
  Administered 2015-05-20: 230.4 mg via INTRAVENOUS

## 2015-05-20 MED ORDER — BUPIVACAINE-EPINEPHRINE (PF) 0.25% -1:200000 IJ SOLN
INTRAMUSCULAR | Status: AC
  Filled 2015-05-20: qty 30

## 2015-05-20 MED ORDER — HYDROMORPHONE HCL 1 MG/ML IJ SOLN
0.2500 mg | INTRAMUSCULAR | Status: DC | PRN
  Administered 2015-05-20 (×2): 0.5 mg via INTRAVENOUS

## 2015-05-20 MED ORDER — DIPHENHYDRAMINE HCL 25 MG PO CAPS
50.0000 mg | ORAL_CAPSULE | Freq: Four times a day (QID) | ORAL | Status: DC | PRN
  Administered 2015-05-20: 50 mg via ORAL
  Filled 2015-05-20: qty 2

## 2015-05-20 MED ORDER — PROPOFOL 10 MG/ML IV BOLUS
INTRAVENOUS | Status: AC
  Filled 2015-05-20: qty 40

## 2015-05-20 MED ORDER — ROCURONIUM BROMIDE 100 MG/10ML IV SOLN
INTRAVENOUS | Status: DC | PRN
  Administered 2015-05-20: 30 mg via INTRAVENOUS
  Administered 2015-05-20: 20 mg via INTRAVENOUS

## 2015-05-20 MED ORDER — SODIUM CHLORIDE 0.9 % IR SOLN
Status: DC | PRN
  Administered 2015-05-20: 1000 mL

## 2015-05-20 MED ORDER — FENTANYL CITRATE (PF) 100 MCG/2ML IJ SOLN
INTRAMUSCULAR | Status: DC | PRN
  Administered 2015-05-20: 25 ug via INTRAVENOUS
  Administered 2015-05-20 (×2): 50 ug via INTRAVENOUS

## 2015-05-20 MED ORDER — BUPIVACAINE-EPINEPHRINE 0.25% -1:200000 IJ SOLN
INTRAMUSCULAR | Status: DC | PRN
  Administered 2015-05-20: 16 mL

## 2015-05-20 MED ORDER — TRAZODONE HCL 50 MG PO TABS: 50.0000 mg | ORAL_TABLET | Freq: Every evening | ORAL | Status: DC | PRN

## 2015-05-20 MED ORDER — LIDOCAINE HCL (CARDIAC) 20 MG/ML IV SOLN
INTRAVENOUS | Status: DC | PRN
  Administered 2015-05-20: 60 mg via INTRAVENOUS

## 2015-05-20 MED ORDER — SODIUM CHLORIDE 0.9 % IV SOLN
INTRAVENOUS | Status: DC | PRN
  Administered 2015-05-20: 50 mL

## 2015-05-20 MED ORDER — MIDAZOLAM HCL 2 MG/2ML IJ SOLN
INTRAMUSCULAR | Status: AC
  Filled 2015-05-20: qty 2

## 2015-05-20 MED ORDER — PROPOFOL 10 MG/ML IV BOLUS
INTRAVENOUS | Status: DC | PRN
  Administered 2015-05-20: 170 mg via INTRAVENOUS

## 2015-05-20 MED ORDER — LACTATED RINGERS IV SOLN
INTRAVENOUS | Status: DC
  Administered 2015-05-20 (×2): via INTRAVENOUS

## 2015-05-20 MED ORDER — SUGAMMADEX SODIUM 500 MG/5ML IV SOLN
INTRAVENOUS | Status: AC
  Filled 2015-05-20: qty 5

## 2015-05-20 MED ORDER — OXYCODONE HCL 5 MG PO TABS
ORAL_TABLET | ORAL | Status: AC
  Filled 2015-05-20: qty 1

## 2015-05-20 MED ORDER — HYDROMORPHONE HCL 1 MG/ML IJ SOLN
INTRAMUSCULAR | Status: AC
  Filled 2015-05-20: qty 1

## 2015-05-20 MED ORDER — FENTANYL CITRATE (PF) 250 MCG/5ML IJ SOLN
INTRAMUSCULAR | Status: AC
  Filled 2015-05-20: qty 5

## 2015-05-20 MED ORDER — KETOROLAC TROMETHAMINE 30 MG/ML IJ SOLN
INTRAMUSCULAR | Status: DC | PRN
  Administered 2015-05-20: 15 mg via INTRAVENOUS

## 2015-05-20 MED ORDER — OXYCODONE HCL 5 MG PO TABS
2.5000 mg | ORAL_TABLET | ORAL | Status: DC | PRN
  Administered 2015-05-20 – 2015-05-21 (×5): 5 mg via ORAL
  Filled 2015-05-20 (×6): qty 1

## 2015-05-20 MED ORDER — OXYCODONE HCL 5 MG PO TABS
5.0000 mg | ORAL_TABLET | ORAL | Status: DC | PRN
Start: 2015-05-20 — End: 2015-05-20

## 2015-05-20 MED ORDER — OXYCODONE HCL 5 MG PO TABS
5.0000 mg | ORAL_TABLET | Freq: Once | ORAL | Status: AC
Start: 2015-05-20 — End: 2015-05-20
  Administered 2015-05-20: 5 mg via ORAL

## 2015-05-20 MED ORDER — MIDAZOLAM HCL 5 MG/5ML IJ SOLN
INTRAMUSCULAR | Status: DC | PRN
  Administered 2015-05-20 (×2): 2 mg via INTRAVENOUS

## 2015-05-20 SURGICAL SUPPLY — 48 items
APPLIER CLIP 5 13 M/L LIGAMAX5 (MISCELLANEOUS) ×3
BLADE SURG ROTATE 9660 (MISCELLANEOUS) IMPLANT
CANISTER SUCTION 2500CC (MISCELLANEOUS) ×3 IMPLANT
CHLORAPREP W/TINT 26ML (MISCELLANEOUS) ×3 IMPLANT
CLIP APPLIE 5 13 M/L LIGAMAX5 (MISCELLANEOUS) ×1 IMPLANT
COVER MAYO STAND STRL (DRAPES) ×3 IMPLANT
COVER SURGICAL LIGHT HANDLE (MISCELLANEOUS) ×3 IMPLANT
DRAPE C-ARM 42X72 X-RAY (DRAPES) ×3 IMPLANT
ELECT REM PT RETURN 9FT ADLT (ELECTROSURGICAL) ×3
ELECTRODE REM PT RTRN 9FT ADLT (ELECTROSURGICAL) ×1 IMPLANT
FILTER SMOKE EVAC LAPAROSHD (FILTER) IMPLANT
GLOVE BIO SURGEON STRL SZ8 (GLOVE) ×3 IMPLANT
GLOVE BIOGEL PI IND STRL 6.5 (GLOVE) ×1 IMPLANT
GLOVE BIOGEL PI IND STRL 8 (GLOVE) ×2 IMPLANT
GLOVE BIOGEL PI IND STRL 8.5 (GLOVE) ×1 IMPLANT
GLOVE BIOGEL PI INDICATOR 6.5 (GLOVE) ×2
GLOVE BIOGEL PI INDICATOR 8 (GLOVE) ×4
GLOVE BIOGEL PI INDICATOR 8.5 (GLOVE) ×2
GLOVE SURG SS PI 6.5 STRL IVOR (GLOVE) ×3 IMPLANT
GLOVE SURG SS PI 7.5 STRL IVOR (GLOVE) ×3 IMPLANT
GLOVE SURG SS PI 8.0 STRL IVOR (GLOVE) ×3 IMPLANT
GOWN STRL REUS W/ TWL LRG LVL3 (GOWN DISPOSABLE) ×2 IMPLANT
GOWN STRL REUS W/ TWL XL LVL3 (GOWN DISPOSABLE) ×1 IMPLANT
GOWN STRL REUS W/TWL LRG LVL3 (GOWN DISPOSABLE) ×4
GOWN STRL REUS W/TWL XL LVL3 (GOWN DISPOSABLE) ×2
KIT BASIN OR (CUSTOM PROCEDURE TRAY) ×3 IMPLANT
KIT ROOM TURNOVER OR (KITS) ×3 IMPLANT
L-HOOK LAP DISP 36CM (ELECTROSURGICAL) ×3
LHOOK LAP DISP 36CM (ELECTROSURGICAL) ×1 IMPLANT
LIQUID BAND (GAUZE/BANDAGES/DRESSINGS) ×3 IMPLANT
NEEDLE 22X1 1/2 (OR ONLY) (NEEDLE) ×3 IMPLANT
NS IRRIG 1000ML POUR BTL (IV SOLUTION) ×3 IMPLANT
PAD ARMBOARD 7.5X6 YLW CONV (MISCELLANEOUS) ×3 IMPLANT
PENCIL BUTTON HOLSTER BLD 10FT (ELECTRODE) ×3 IMPLANT
POUCH RETRIEVAL ECOSAC 10 (ENDOMECHANICALS) ×1 IMPLANT
POUCH RETRIEVAL ECOSAC 10MM (ENDOMECHANICALS) ×2
SCISSORS LAP 5X35 DISP (ENDOMECHANICALS) ×3 IMPLANT
SET CHOLANGIOGRAPH 5 50 .035 (SET/KITS/TRAYS/PACK) ×3 IMPLANT
SET IRRIG TUBING LAPAROSCOPIC (IRRIGATION / IRRIGATOR) ×3 IMPLANT
SLEEVE ENDOPATH XCEL 5M (ENDOMECHANICALS) ×6 IMPLANT
SPECIMEN JAR SMALL (MISCELLANEOUS) ×3 IMPLANT
SUT VIC AB 4-0 PS2 27 (SUTURE) ×3 IMPLANT
TOWEL OR 17X24 6PK STRL BLUE (TOWEL DISPOSABLE) ×3 IMPLANT
TOWEL OR 17X26 10 PK STRL BLUE (TOWEL DISPOSABLE) ×3 IMPLANT
TRAY LAPAROSCOPIC MC (CUSTOM PROCEDURE TRAY) ×3 IMPLANT
TROCAR XCEL BLUNT TIP 100MML (ENDOMECHANICALS) ×3 IMPLANT
TROCAR XCEL NON-BLD 5MMX100MML (ENDOMECHANICALS) ×3 IMPLANT
TUBING INSUFFLATION (TUBING) ×3 IMPLANT

## 2015-05-20 NOTE — Progress Notes (Signed)
Family Medicine Teaching Service Daily Progress Note Intern Pager: [phone]  Patient name: Kimberly Lynch Medical record number: [id number] Date of birth: Apr 29, 1985 Age: 30 y.o. Gender: female  Primary Care Provider: No Pcp Consultants: Surgery  Code Status: Full  Pt Overview and Major Events to Date:   Assessment and Plan: Kimberly Lynch is a 30 y.o. female presenting with right upper quadrant abdominal pain . PMH is significant for anxiety and bipolar 1 disorder.  Upper quadrant abdominal pain:S/P cholecystectomy. Tenderness througout. History of intermittent abdominal pain over the course of a few months.  Patient was afebrile on admission, with white blood cell count of 9.6. Right upper quadrant ultrasound was performed to begin for multiple gallstones with moderate gallbladder wall thickening concerning for acute cholecystitis. Differential to include acute cholecystitis versus pancreatitis versus gastric ulcer. However likely cholecystitis per imaging and location of pain.Lipase negative Surgery consulted, HIDA scan with 32% flow, normal about 40%; surgery with cholecystectomy  - Patient refused pelvic exam, she feels she does not have lower quadrant abdominal pain and does not want pelvic exam. Pregnancy test negative. No vaginal discharge.  - Ready for discharge later today once tolerating diet per Dr. Azucena Cecil.   Bipolar 1 disorder/Anxiety: Takes lithium (last dose 2 days ago) and Prozac. UDS negative  - Will continue these medications  FEN/GI: Diet Regular Prophylaxis: Lovenox   Disposition: Med-surg   Subjective:  Patient with compliant of abdominal pain. No chest pain, n/v, or SOB   Objective: Temp:  [97 F (36.1 C)-98.4 F (36.9 C)] 97 F (36.1 C) (02/23 1144) Pulse Rate:  [58-74] 64 (02/23 1144) Resp:  [15-21] 15 (02/23 1144) BP: (96-111)/(61-79) 109/79 mmHg (02/23 1136) SpO2:  [97 %-100 %] 100 % (02/23 1144) Physical Exam: General: lying in bed,  NAD Cardiovascular: RRR, no murmurs, rubs or gallops Respiratory: CTAB, no wheezes or rhonchi  Abdomen: BS+, tenderness throughout,  no rebound or guarding  Extremities: SCDS in place   Laboratory:  Recent Labs Lab 05/18/15 0947 05/19/15 0627 05/20/15 0535  WBC 7.2 7.8 8.8  HGB 13.3 13.9 13.7  HCT 40.2 41.9 41.4  PLT 286 246 259    Recent Labs Lab 05/18/15 0947 05/19/15 0627 05/20/15 0535  NA 142 143 140  K 3.9 3.6 3.4*  CL 112* 105 106  CO2 BUN <5* <5* 6  CREATININE 0.48 0.64 0.70  CALCIUM 8.5* 9.1 9.0  PROT 6.3* 6.0* 6.1*  BILITOT 0.3 0.2* 0.2*  ALKPHOS 63 60 59  ALT 14 13* 15  AST GLUCOSE 77 76 84     Imaging/Diagnostic Tests: No results found.   Kimberly Mayra Reel, MD 05/20/2015, 1:02 PM PGY-1, [facility] FPTS Intern pager: [phone], text pages welcome

## 2015-05-20 NOTE — Anesthesia Preprocedure Evaluation (Signed)
Anesthesia Evaluation  Patient identified by MRN, date of birth, ID band Patient awake    Reviewed: Allergy & Precautions, NPO status , Patient's Chart, lab work & pertinent test results  Airway Mallampati: II  TM Distance: >3 FB Neck ROM: Full    Dental   Pulmonary Current Smoker,    breath sounds clear to auscultation       Cardiovascular negative cardio ROS   Rhythm:Regular Rate:Normal     Neuro/Psych    GI/Hepatic negative GI ROS, Neg liver ROS,   Endo/Other  negative endocrine ROS  Renal/GU negative Renal ROS     Musculoskeletal   Abdominal   Peds  Hematology   Anesthesia Other Findings   Reproductive/Obstetrics                             Anesthesia Physical Anesthesia Plan  ASA: III  Anesthesia Plan: General   Post-op Pain Management:    Induction: Intravenous  Airway Management Planned: Oral ETT  Additional Equipment:   Intra-op Plan:   Post-operative Plan: Extubation in OR  Informed Consent: I have reviewed the patients History and Physical, chart, labs and discussed the procedure including the risks, benefits and alternatives for the proposed anesthesia with the patient or authorized representative who has indicated his/her understanding and acceptance.   Dental advisory given  Plan Discussed with: CRNA and Anesthesiologist  Anesthesia Plan Comments:         Anesthesia Quick Evaluation

## 2015-05-20 NOTE — Anesthesia Procedure Notes (Addendum)
Procedure Name: Intubation Date/Time: 05/20/2015 9:47 AM Performed by: Bobbie Stack Pre-anesthesia Checklist: Patient identified, Timeout performed, Emergency Drugs available, Suction available and Patient being monitored Patient Re-evaluated:Patient Re-evaluated prior to inductionOxygen Delivery Method: Circle system utilized Preoxygenation: Pre-oxygenation with 100% oxygen Intubation Type: IV induction Ventilation: Mask ventilation without difficulty Laryngoscope Size: Miller and 2 Grade View: Grade I Tube type: Oral Tube size: 7.0 mm Number of attempts: 1 Airway Equipment and Method: Stylet Placement Confirmation: breath sounds checked- equal and bilateral,  ETT inserted through vocal cords under direct vision and positive ETCO2 Secured at: 22 cm Tube secured with: Tape Dental Injury: Teeth and Oropharynx as per pre-operative assessment  Comments: Pt with very poor dentition.  Missing and chipped teeth.  Teeth remained as pre-op assessment after intubation.

## 2015-05-20 NOTE — Transfer of Care (Signed)
Immediate Anesthesia Transfer of Care Note  Patient: Kimberly Lynch  Procedure(s) Performed: Procedure(s): LAPAROSCOPIC CHOLECYSTECTOMY WITH ATTEMPTED INTRAOPERATIVE CHOLANGIOGRAM (N/A)  Patient Location: PACU  Anesthesia Type:General  Level of Consciousness: awake, alert , oriented and patient cooperative  Airway & Oxygen Therapy: Patient Spontanous Breathing and Patient connected to nasal cannula oxygen  Post-op Assessment: Report given to RN, Post -op Vital signs reviewed and stable and Patient moving all extremities  Post vital signs: Reviewed and stable  Last Vitals:  Filed Vitals:   05/19/15 2130 05/20/15 0710  BP: 96/61 101/68  Pulse: 68 58  Temp: 36.9 C 36.6 C  Resp:  16    Complications: No apparent anesthesia complications

## 2015-05-20 NOTE — Op Note (Signed)
05/17/2015 - 05/20/2015  10:44 AM  PATIENT:  Kimberly Lynch  30 y.o. female  PRE-OPERATIVE DIAGNOSIS:  cholelithiasis  POST-OPERATIVE DIAGNOSIS:  Chronic cholecystitis  PROCEDURE:  Procedure(s): LAPAROSCOPIC CHOLECYSTECTOMY WITH ATTEMPTED INTRAOPERATIVE CHOLANGIOGRAM  SURGEON:  Surgeon(s): Violeta Gelinas, MD  ASSISTANTS: none   ANESTHESIA:   local and general  EBL:  Total I/O In: -  Out: 10 [Blood:10]  BLOOD ADMINISTERED:none  DRAINS: none   SPECIMEN:  Excision  DISPOSITION OF SPECIMEN:  PATHOLOGY  COUNTS:  YES  DICTATION: .Dragon Dictation Findings: Chronic cholecystitis  Procedure in detail: [name] is brought for cholecystectomy. She received intravenous antibiotics. Informed consent was obtained. She was identified in the preop holding area. She was brought to the operating room and general endotracheal anesthesia was administered by the anesthesia staff. Her abdomen was prepped and draped in sterile fashion. Time out procedure was performed.The infraumbilical region was infiltrated with local. Infraumbilical incision was made. Subcutaneous tissues were dissected down revealing the anterior fascia. This was divided sharply along the midline. Peritoneal cavity was entered under direct vision without complication. A 0 Vicryl pursestring was placed around the fascial opening. Hassan trocar was inserted into the abdomen. The abdomen was insufflated with carbon dioxide in standard fashion. Under direct vision a 5 mm epigastric and 5 mm right ports 2 were placed. Local was used at the port sites. Laparoscopic expiration revealed evidence of chronic cholecystitis with a lot of omentum stuck on the body of the gallbladder. The dome the gallbladder was retracted superior medially. The omentum was gradually dissected off gallbladder and this revealed the infundibulum. The infundibulum was retracted inferior laterally. Dissection began laterally and progressed medially easily identifying  the cystic duct. Dissection continued until we had a critical view between the cystic duct, the infundibulum, and the liver. 3 clips were placed proximally on the cystic duct one was placed distally and it was divided. Further dissection revealed both anterior and posterior branch of the cystic artery. These were both clipped twice proximally and divided. Gallbladder was taken off the liver bed with Bovie cautery. Hemostasis was good. Gallbladder was placed in a bag and removed from the infraumbilical port site. Liver bed was irrigated. It was dry. Clips were in good position. Ports were removed under direct vision. Pneumoperitoneum was released. Infraumbilical fascia was closed by tying the pursestring suture. All 4 wounds were copiously irrigated and the skin of each was closed with running 4 Vicryl subcuticular followed by liquid gland. All counts were correct. She tolerated procedure without apparent complication and was taken recovery in stable condition.  PATIENT DISPOSITION:  PACU - hemodynamically stable.   Delay start of Pharmacological VTE agent (>24hrs) due to surgical blood loss or risk of bleeding:  no  Violeta Gelinas, MD, MPH, FACS Pager: [phone]  2/23/201710:44 AM

## 2015-05-20 NOTE — Progress Notes (Signed)
Patient is back from surgery very demanding about getting her nose, belly, and lip ring back in.  Encouraged patient to wait to let me get her settled from surgery but patient began getting out of bed to put her piercings back in.  Patient's belly ring is directly above the incision site from surgery.  I discouraged patient to put belly ring back in but patient refused to listen.  Patient stated that "if it gets irritated then i will take it back out".

## 2015-05-20 NOTE — Care Management Note (Addendum)
Case Management Note  Patient Details  Name: Kimberly Lynch MRN: [id number] Date of Birth: [birth year]  Subjective/Objective:                 Admitted  with RUQ pain, with evidence of possible cholecystitis on abdominal ultrasound.PMH: anxiety and bipolar 1 disorder. Independent with ADL's pta .      Action/Plan: SURGERY : 05/20/15  Expected Discharge Date:                  Expected Discharge Plan:  Home/Self Care  In-House Referral:     Discharge planning Services  CM Consult  Post Acute Care Choice:    Choice offered to:     DME Arranged:    DME Agency:     HH Arranged:    HH Agency:     Status of Service:  In process, will continue to follow  Medicare Important Message Given:    Date Medicare IM Given:    Medicare IM give by:    Date Additional Medicare IM Given:    Additional Medicare Important Message give by:     If discussed at Long Length of Stay Meetings, dates discussed:    Additional Comments: Starla Link [facility])  [phone]  Gae Gallop [place], [place] [phone] 05/20/2015, 8:28 AM

## 2015-05-20 NOTE — OR Nursing (Signed)
Patient was educated about nose ring and upper lip jewelry to be removed preoperatively. Nose ring and upper lip ring was safely removed in the operating room suite. Placed in a bag with a patient label and attached to patient chart.

## 2015-05-20 NOTE — Progress Notes (Signed)
UR COMPLETED  

## 2015-05-20 NOTE — Progress Notes (Signed)
Patient had allergic reaction to CHG wipes patient had red Rash and itching . Benadryl ordered

## 2015-05-21 ENCOUNTER — Encounter (HOSPITAL_COMMUNITY): Payer: Self-pay | Admitting: General Surgery

## 2015-05-21 LAB — TROPONIN I: Troponin I: <0.03 ng/mL (ref <0.031)

## 2015-05-21 MED ORDER — OXYCODONE HCL 5 MG PO TABS
5.0000 mg | ORAL_TABLET | ORAL | Status: DC | PRN
Start: 2015-05-21 — End: 2016-02-23

## 2015-05-21 MED ORDER — SODIUM CHLORIDE 0.9 % IV BOLUS (SEPSIS)
500.0000 mL | Freq: Once | INTRAVENOUS | Status: AC
  Administered 2015-05-21: 500 mL via INTRAVENOUS

## 2015-05-21 MED ORDER — POLYETHYLENE GLYCOL 3350 17 GM/SCOOP PO POWD: 1.0000 | Freq: Once | ORAL | Status: DC

## 2015-05-21 MED ORDER — ACETAMINOPHEN 325 MG PO TABS: 650.0000 mg | ORAL_TABLET | Freq: Four times a day (QID) | ORAL | Status: DC | PRN

## 2015-05-21 MED ORDER — OXYCODONE HCL 5 MG PO TABS
5.0000 mg | ORAL_TABLET | Freq: Once | ORAL | Status: AC
  Administered 2015-05-21: 5 mg via ORAL
  Filled 2015-05-21: qty 1

## 2015-05-21 MED ORDER — IBUPROFEN 600 MG PO TABS: 600.0000 mg | ORAL_TABLET | Freq: Four times a day (QID) | ORAL | Status: DC | PRN

## 2015-05-21 NOTE — Care Management Important Message (Signed)
Important Message  Patient Details  Name: Kimberly Lynch MRN: [id number] Date of Birth: 05/15/1985   Medicare Important Message Given:  Yes    [name] 05/21/2015, 11:39 AM

## 2015-05-21 NOTE — Care Management Note (Signed)
Case Management Note  Patient Details  Name: Kimberly Lynch MRN: [id number] Date of Birth: 11-Aug-1985  Subjective/Objective:  Admitted with chronic cholecystitis with S/P  LAPAROSCOPIC CHOLECYSTECTOMY WITH ATTEMPTED INTRAOPERATIVE CHOLANGIOGRAM  05/20/15             Action/Plan: Plan is  to d/c to home today. No further needs identified per CM.  Expected Discharge Date:                  Expected Discharge Plan:  Home/Self Care  In-House Referral:     Discharge planning Services  CM Consult  Post Acute Care Choice:    Choice offered to:     DME Arranged:    DME Agency:     HH Arranged:    HH Agency:     Status of Service:  Completed, signed off  Medicare Important Message Given:  Yes Date Medicare IM Given:    Medicare IM give by:    Date Additional Medicare IM Given:    Additional Medicare Important Message give by:     If discussed at Long Length of Stay Meetings, dates discussed:    Additional Comments:  Epifanio Lesches, [place] [phone] 05/21/2015, 12:06 PM

## 2015-05-21 NOTE — Progress Notes (Signed)
FPTS Interim Progress Note  S: Nursing noted of consistent BP  81/52 and 82/57. Patient's BP at baseline since hospitalization is 90s to 100s for SBP. Patient reports she fees dizzy and lightheaded with standing but this is a chronic issue. Additionally states that her blood pressure at home sometimes is in the 80s. She is also complaining of 10/10 pain in her abdomen and is asking for 2 pills of roxi instead of 1 pill. States is hurts to even cough. While I was in the room she stated she had central chest pain with radiation to the back/shoulders/left arm and shortness of breath.   O: BP 109/81 mmHg  Pulse 74  Temp(Src) 98 F (36.7 C) (Oral)  Resp 18  Ht  (1.626 m)  Wt 57.607 kg (127 lb)  BMI 21.79 kg/m2  SpO2 99%  LMP 05/06/2015 (Approximate)  Gen: in some distress Heart: RRR, no m/r/g Chest wall: tenderness to palpation of right sternal border which patient reports is the same chest pain she feels  Lungs: normal effort, CTAB  Abdomen: + BS, diffuse tenderness to palpation but no guarding or rebound.  A/P: - 500cc bolus (patient's BP improved before giving bolus, likely due to pain) - Unlikely cardiac in nature but will get EKG and troponin. Unlikely PE as no desaturation or tachycardia.  - patient has Roxi 2.5-5mg  PRN   Palma Holter, MD 05/21/2015, 3:10 AM PGY-1, [facility] pager [phone]

## 2015-05-21 NOTE — Progress Notes (Signed)
NURSING PROGRESS NOTE  MARCEDES TECH [id number] Discharge Data: 05/21/2015 12:56 PM Attending Provider: Latrelle Dodrill, MD PCP:No Pcp     [name] to be D/C'd Home per MD order.  Discussed with the patient the After Visit Summary and all questions fully answered. All IV's discontinued with no bleeding noted. All belongings returned to patient for patient to take home.   Last Vital Signs:  Blood pressure 96/56, pulse 73, temperature 98.4 F (36.9 C), temperature source Oral, resp. rate 18, height  (1.626 m), weight 57.607 kg (127 lb), last menstrual period 05/06/2015, SpO2 100 %.  Discharge Medication List   Medication List    STOP taking these medications        tiZANidine 2 MG tablet  Commonly known as:  ZANAFLEX      TAKE these medications        acetaminophen 325 MG tablet  Commonly known as:  TYLENOL  Take 2 tablets (650 mg total) by mouth every 6 (six) hours as needed for mild pain (or Fever >/= 101).     clonazePAM 0.5 MG tablet  Commonly known as:  KLONOPIN  Take 1 tablet (0.5 mg total) by mouth 2 (two) times daily.     FLUoxetine 20 MG capsule  Commonly known as:  PROZAC  Take 1 capsule (20 mg total) by mouth daily.     ibuprofen 600 MG tablet  Commonly known as:  ADVIL,MOTRIN  Take 1 tablet (600 mg total) by mouth every 6 (six) hours as needed for moderate pain.     lithium carbonate 300 MG CR tablet  Commonly known as:  LITHOBID  Take 1 tablet (300 mg total) by mouth 2 (two) times daily.     NYQUIL COLD & FLU PO  Take 1 capsule by mouth at bedtime as needed (sleep).     oxyCODONE 5 MG immediate release tablet  Commonly known as:  Oxy IR/ROXICODONE  Take 1 tablet (5 mg total) by mouth every 4 (four) hours as needed for moderate pain or severe pain.     polyethylene glycol powder powder  Commonly known as:  GLYCOLAX/MIRALAX  Take 255 g by mouth once.         Bennie Pierini, RN

## 2015-05-21 NOTE — Discharge Instructions (Signed)
Laparoscopic Cholecystectomy, Care After °Refer to this sheet in the next few weeks. These instructions provide you with information about caring for yourself after your procedure. Your health care provider may also give you more specific instructions. Your treatment has been planned according to current medical practices, but problems sometimes occur. Call your health care provider if you have any problems or questions after your procedure. °WHAT TO EXPECT AFTER THE PROCEDURE °After your procedure, it is common to have: °· Pain at your incision sites. You will be given pain medicines to control your pain. °· Mild nausea or vomiting. This should improve after the first 24 hours. °· Bloating and possible shoulder pain from the gas that was used during the procedure. This will improve after the first 24 hours. °HOME CARE INSTRUCTIONS °Incision Care °· Follow instructions from your health care provider about how to take care of your incisions. Make sure you: °¨ Wash your hands with soap and water before you change your bandage (dressing). If soap and water are not available, use hand sanitizer. °¨ Change your dressing as told by your health care provider. °¨ Leave stitches (sutures), skin glue, or adhesive strips in place. These skin closures may need to be in place for 2 weeks or longer. If adhesive strip edges start to loosen and curl up, you may trim the loose edges. Do not remove adhesive strips completely unless your health care provider tells you to do that. °· Do not take baths, swim, or use a hot tub until your health care provider approves. Ask your health care provider if you can take showers. You may only be allowed to take sponge baths for bathing. °General Instructions °· Take over-the-counter and prescription medicines only as told by your health care provider. °· Do not drive or operate heavy machinery while taking prescription pain medicine. °· Return to your normal diet as told by your health care  provider. °· Do not lift anything that is heavier than 10 lb (4.5 kg). °· Do not play contact sports for one week or until your health care provider approves. °SEEK MEDICAL CARE IF:  °· You have redness, swelling, or pain at the site of your incision. °· You have fluid, blood, or pus coming from your incision. °· You notice a bad smell coming from your incision area. °· Your surgical incisions break open. °· You have a fever. °SEEK IMMEDIATE MEDICAL CARE IF: °· You develop a rash. °· You have difficulty breathing. °· You have chest pain. °· You have increasing pain in your shoulders (shoulder strap areas). °· You faint or have dizzy episodes while you are standing. °· You have severe pain in your abdomen. °· You have nausea or vomiting that lasts for more than one day. °  °This information is not intended to replace advice given to you by your health care provider. Make sure you discuss any questions you have with your health care provider. °  °Document Released: 03/13/2005 Document Revised: 12/02/2014 Document Reviewed: 10/23/2012 °Elsevier Interactive Patient Education ©2016 [employer] °CCS ______[facility] °LAPAROSCOPIC SURGERY: POST OP INSTRUCTIONS °Always review your discharge instruction sheet given to you by the facility where your surgery was performed. °IF YOU HAVE DISABILITY OR FAMILY LEAVE FORMS, YOU MUST BRING THEM TO THE OFFICE FOR PROCESSING.   °DO NOT GIVE THEM TO YOUR DOCTOR. ° °1. A prescription for pain medication may be given to you upon discharge.  Take your pain medication as prescribed, if needed.  If narcotic   pain medicine is not needed, then you may take acetaminophen (Tylenol) or ibuprofen (Advil) as needed. °2. Take your usually prescribed medications unless otherwise directed. °3. If you need a refill on your pain medication, please contact your pharmacy.  They will contact our office to request authorization. Prescriptions will not be filled after 5pm or on  week-ends. °4. You should follow a light diet the first few days after arrival home, such as soup and crackers, etc.  Be sure to include lots of fluids daily. °5. Most patients will experience some swelling and bruising in the area of the incisions.  Ice packs will help.  Swelling and bruising can take several days to resolve.  °6. It is common to experience some constipation if taking pain medication after surgery.  Increasing fluid intake and taking a stool softener (such as Colace) will usually help or prevent this problem from occurring.  A mild laxative (Milk of Magnesia or Miralax) should be taken according to package instructions if there are no bowel movements after 48 hours. °7. Unless discharge instructions indicate otherwise, you may remove your bandages 24-48 hours after surgery, and you may shower at that time.  You may have steri-strips (small skin tapes) in place directly over the incision.  These strips should be left on the skin for 7-10 days.  If your surgeon used skin glue on the incision, you may shower in 24 hours.  The glue will flake off over the next 2-3 weeks.  Any sutures or staples will be removed at the office during your follow-up visit. °8. ACTIVITIES:  You may resume regular (light) daily activities beginning the next day--such as daily self-care, walking, climbing stairs--gradually increasing activities as tolerated.  You may have sexual intercourse when it is comfortable.  Refrain from any heavy lifting or straining until approved by your doctor. °a. You may drive when you are no longer taking prescription pain medication, you can comfortably wear a seatbelt, and you can safely maneuver your car and apply brakes. °b. RETURN TO WORK:  __________________________________________________________ °9. You should see your doctor in the office for a follow-up appointment approximately 2-3 weeks after your surgery.  Make sure that you call for this appointment within a day or two after you  arrive home to insure a convenient appointment time. °10. OTHER INSTRUCTIONS: __________________________________________________________________________________________________________________________ __________________________________________________________________________________________________________________________ °WHEN TO CALL YOUR DOCTOR: °1. Fever over 101.0 °2. Inability to urinate °3. Continued bleeding from incision. °4. Increased pain, redness, or drainage from the incision. °5. Increasing abdominal pain ° °The clinic staff is available to answer your questions during regular business hours.  Please don’t hesitate to call and ask to speak to one of the nurses for clinical concerns.  If you have a medical emergency, go to the nearest emergency room or call 911.  A surgeon from [facility] is always on call at the hospital. °[address], [address], [place], [place]  [address] ? [address] [address], [place], [place]   [address] °[phone] ? [phone] ? FAX [phone] °Web site: [email or web address] ° °

## 2015-05-21 NOTE — Progress Notes (Signed)
1 Day Post-Op  Subjective: She ate a good breakfast without issue.  Her sites all look fine.  She is rather sore, but better than before. Objective: Vital signs in last 24 hours: Temp:  [97 F (36.1 C)-98.4 F (36.9 C)] 98.4 F (36.9 C) (02/24 0627) Pulse Rate:  [64-76] 73 (02/24 0627) Resp:  [15-22] 18 (02/24 0627) BP: (81-118)/(52-83) 96/56 mmHg (02/24 0627) SpO2:  [97 %-100 %] 100 % (02/24 0627) Last BM Date: 05/18/15 228 PO recorded  Regular diet 1000 urine output Afebrile, BP is always low No labs No labs this AM Intake/Output from previous day: 02/23 0701 - 02/24 0700 In: 1178 [P.O.:228; I.V.:950] Out: 1010 [Urine:1000; Blood:10] Intake/Output this shift:    General appearance: alert, cooperative and no distress GI: soft, tender, sites all look good.  Lab Results:   Recent Labs  05/19/15 0627 05/20/15 0535  WBC 7.8 8.8  HGB 13.9 13.7  HCT 41.9 41.4  PLT 246 259    BMET  Recent Labs  05/19/15 0627 05/20/15 0535  NA 143 140  K 3.6 3.4*  CL 105 106  CO2 25 25  GLUCOSE 76 84  BUN <5* 6  CREATININE 0.64 0.70  CALCIUM 9.1 9.0   PT/INR No results for input(s): LABPROT, INR in the last 72 hours.   Recent Labs Lab 05/17/15 0344 05/18/15 0947 05/19/15 0627 05/20/15 0535  AST ALT 15 14 13* 15  ALKPHOS 60 63 60 59  BILITOT <0.1* 0.3 0.2* 0.2*  PROT 6.6 6.3* 6.0* 6.1*  ALBUMIN 3.9 3.4* 3.5 3.5     Lipase     Component Value Date/Time   LIPASE 21 05/19/2015 0627     Studies/Results: No results found.  Medications: . clonazePAM  0.5 mg Oral BID  . enoxaparin (LOVENOX) injection  40 mg Subcutaneous Q24H  . FLUoxetine  20 mg Oral Daily  . lithium carbonate  300 mg Oral BID  . nicotine  14 mg Transdermal Q24H    Assessment/Plan Chronic cholecystitis, cholelithiasis Bipolar disorder on Lithium  Anxiety/panic attacks Depression Headaches/near syncope with abnormal MRI - followed by Dr. Stephanie Acre Hx of ETOH use, on  going tobacco use Insomnia  Antibiotics: None DVT: Lovenox    Plan:  From our standpoint she can go home.  Discharge follow up and instructions are in the AVS.  She has follow up appointment for 06/09/15. We will see her back in the office.  Call if there are any issues; Tylenol, ibuprofen, oxycodone or hydrocodone combinations as needed for pain.  She can shower and leave glue alone.    LOS: 4 days    Kimberly Lynch 05/21/2015

## 2015-05-25 ENCOUNTER — Telehealth: Payer: Self-pay | Admitting: Neurology

## 2015-05-25 MED ORDER — BACLOFEN 10 MG PO TABS: 5.0000 mg | ORAL_TABLET | Freq: Two times a day (BID) | ORAL | Status: DC

## 2015-05-25 NOTE — Telephone Encounter (Signed)
I called the patient. The patient got oxycodone following gallbladder surgery. I do not wish to prescribe this medication for her, she has chronic pain issues, I am concerned about opiate addiction. I will call in baclofen. The patient claims that the tizanidine increased her headache.

## 2015-05-25 NOTE — Telephone Encounter (Signed)
Pt called said flexeril  is causing HA's. She had gall bladder surgery 05/22/15 and was been prescribed Oxyocodone  for pain 2 x day #30. Sts she is not having any problems with HA's or head pressure. She is inquiring if Dr Anne Hahn will prescribe this medication for HA's, she will be running out in about 2 weeks.

## 2015-06-01 NOTE — Anesthesia Postprocedure Evaluation (Signed)
Anesthesia Post Note  Patient: Kimberly Lynch  Procedure(s) Performed: Procedure(s) (LRB): LAPAROSCOPIC CHOLECYSTECTOMY WITH ATTEMPTED INTRAOPERATIVE CHOLANGIOGRAM (N/A)  Patient location during evaluation: PACU Anesthesia Type: General Level of consciousness: awake Pain management: pain level controlled Vital Signs Assessment: post-procedure vital signs reviewed and stable Respiratory status: spontaneous breathing Cardiovascular status: stable Anesthetic complications: no    Last Vitals:  Filed Vitals:   05/21/15 0428 05/21/15 0627  BP: 118/83 96/56  Pulse: 73 73  Temp:  36.9 C  Resp: 20 18    Last Pain:  Filed Vitals:   05/21/15 1056  PainSc: Asleep                 EDWARDS,Alaylah Heatherington

## 2015-07-07 ENCOUNTER — Encounter (HOSPITAL_COMMUNITY): Payer: Self-pay | Admitting: Psychiatry

## 2015-07-07 ENCOUNTER — Ambulatory Visit (INDEPENDENT_AMBULATORY_CARE_PROVIDER_SITE_OTHER): Payer: Medicare Other | Admitting: Psychiatry

## 2015-07-07 VITALS — BP 114/74 | HR 71 | Ht 64.0 in | Wt 126.2 lb

## 2015-07-07 DIAGNOSIS — F319 Bipolar disorder, unspecified: Secondary | ICD-10-CM

## 2015-07-07 MED ORDER — CLONAZEPAM 0.5 MG PO TABS: 0.5000 mg | ORAL_TABLET | Freq: Two times a day (BID) | ORAL | Status: DC

## 2015-07-07 MED ORDER — LITHIUM CARBONATE ER 300 MG PO TBCR: 300.0000 mg | EXTENDED_RELEASE_TABLET | Freq: Two times a day (BID) | ORAL | Status: DC

## 2015-07-07 MED ORDER — FLUOXETINE HCL 20 MG PO CAPS: 20.0000 mg | ORAL_CAPSULE | Freq: Every day | ORAL | Status: DC
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
## 2015-07-15 ENCOUNTER — Ambulatory Visit (HOSPITAL_COMMUNITY): Payer: Self-pay | Admitting: Psychiatry

## 2015-07-19 ENCOUNTER — Telehealth: Payer: Self-pay | Admitting: *Deleted

## 2015-07-19 ENCOUNTER — Ambulatory Visit: Payer: Medicare Other | Admitting: Adult Health

## 2015-07-19 NOTE — Telephone Encounter (Signed)
Pt no showed appt today

## 2015-07-20 ENCOUNTER — Encounter: Payer: Self-pay | Admitting: Adult Health

## 2015-10-13 ENCOUNTER — Ambulatory Visit (HOSPITAL_COMMUNITY): Payer: Self-pay | Admitting: Psychiatry

## 2015-11-06 ENCOUNTER — Encounter (HOSPITAL_COMMUNITY): Payer: Self-pay

## 2015-11-06 ENCOUNTER — Emergency Department (HOSPITAL_COMMUNITY): Payer: Medicare Other

## 2015-11-06 ENCOUNTER — Emergency Department (HOSPITAL_COMMUNITY)
Admission: EM | Admit: 2015-11-06 | Discharge: 2015-11-06 | Disposition: A | Discharge status: Home / Self Care | Payer: Medicare Other | Attending: Emergency Medicine | Admitting: Emergency Medicine

## 2015-11-06 DIAGNOSIS — Y9301 Activity, walking, marching and hiking: Secondary | ICD-10-CM | POA: Diagnosis not present

## 2015-11-06 DIAGNOSIS — M79641 Pain in right hand: Secondary | ICD-10-CM | POA: Diagnosis not present

## 2015-11-06 DIAGNOSIS — Y999 Unspecified external cause status: Secondary | ICD-10-CM | POA: Diagnosis not present

## 2015-11-06 DIAGNOSIS — Y929 Unspecified place or not applicable: Secondary | ICD-10-CM | POA: Diagnosis not present

## 2015-11-06 DIAGNOSIS — S6991XA Unspecified injury of right wrist, hand and finger(s), initial encounter: Secondary | ICD-10-CM | POA: Diagnosis present

## 2015-11-06 DIAGNOSIS — S60221A Contusion of right hand, initial encounter: Secondary | ICD-10-CM | POA: Insufficient documentation

## 2015-11-06 DIAGNOSIS — L03113 Cellulitis of right upper limb: Secondary | ICD-10-CM | POA: Diagnosis not present

## 2015-11-06 DIAGNOSIS — F17210 Nicotine dependence, cigarettes, uncomplicated: Secondary | ICD-10-CM | POA: Diagnosis not present

## 2015-11-06 DIAGNOSIS — M7989 Other specified soft tissue disorders: Secondary | ICD-10-CM | POA: Diagnosis not present

## 2015-11-06 DIAGNOSIS — W2201XA Walked into wall, initial encounter: Secondary | ICD-10-CM | POA: Insufficient documentation

## 2015-11-06 MED ORDER — IBUPROFEN 800 MG PO TABS: 800.0000 mg | ORAL_TABLET | Freq: Three times a day (TID) | ORAL | 0 refills | Status: DC

## 2015-11-06 MED ORDER — HYDROCODONE-ACETAMINOPHEN 5-325 MG PO TABS
1.0000 | ORAL_TABLET | Freq: Once | ORAL | Status: AC
  Administered 2015-11-06: 1 via ORAL
  Filled 2015-11-06: qty 1

## 2015-11-06 MED ORDER — TRAMADOL HCL 50 MG PO TABS: 50.0000 mg | ORAL_TABLET | Freq: Four times a day (QID) | ORAL | 0 refills | Status: DC | PRN

## 2015-11-06 MED ORDER — AMOXICILLIN-POT CLAVULANATE 875-125 MG PO TABS: 1.0000 | ORAL_TABLET | Freq: Two times a day (BID) | ORAL | 0 refills | Status: DC

## 2015-11-06 NOTE — ED Notes (Signed)
Patient verbalized understanding of discharge instructions and denies any further needs or questions at this time. VS stable. Patient ambulatory with steady gait, declined wheelchair. RN escorted to ED entrance.   

## 2015-11-06 NOTE — ED Notes (Signed)
Patient transported to CT 
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
## 2015-11-06 NOTE — ED Triage Notes (Signed)
patient complains of right hand pain after punching wall last pm. States on Thursday night she describes dislocation to index finger of same right hand but unsure of how that injury occurred, arrived with ace wrap to same
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
## 2015-11-08 ENCOUNTER — Other Ambulatory Visit (HOSPITAL_COMMUNITY): Payer: Self-pay

## 2015-11-08 DIAGNOSIS — F319 Bipolar disorder, unspecified: Secondary | ICD-10-CM

## 2015-11-08 MED ORDER — LITHIUM CARBONATE ER 300 MG PO TBCR
300.0000 mg | EXTENDED_RELEASE_TABLET | Freq: Two times a day (BID) | ORAL | 0 refills | Status: DC
Start: 2015-11-08 — End: 2015-12-21

## 2015-11-08 MED ORDER — CLONAZEPAM 0.5 MG PO TABS
0.5000 mg | ORAL_TABLET | Freq: Two times a day (BID) | ORAL | 0 refills | Status: DC
Start: 2015-11-08 — End: 2015-12-21

## 2015-11-08 MED ORDER — FLUOXETINE HCL 20 MG PO CAPS: 20.0000 mg | ORAL_CAPSULE | Freq: Every day | ORAL | 0 refills | Status: DC

## 2015-11-26 DIAGNOSIS — G44219 Episodic tension-type headache, not intractable: Secondary | ICD-10-CM | POA: Diagnosis not present

## 2015-12-20 ENCOUNTER — Telehealth (HOSPITAL_COMMUNITY): Payer: Self-pay

## 2015-12-20 NOTE — Telephone Encounter (Signed)
Medication refills - Pt. left message she is out of all medications and needs refills to last until rescheduled appt. 12/30/15. Last evaluated on 07/07/15, and was cancelled 10/13/15. Last refills 11/08/15.

## 2015-12-21 ENCOUNTER — Telehealth (HOSPITAL_COMMUNITY): Payer: Self-pay

## 2015-12-21 DIAGNOSIS — F319 Bipolar disorder, unspecified: Secondary | ICD-10-CM

## 2015-12-21 MED ORDER — CLONAZEPAM 0.5 MG PO TABS: 0.5000 mg | ORAL_TABLET | Freq: Two times a day (BID) | ORAL | 0 refills | Status: DC

## 2015-12-21 MED ORDER — FLUOXETINE HCL 20 MG PO CAPS: 20.0000 mg | ORAL_CAPSULE | Freq: Every day | ORAL | 0 refills | Status: DC

## 2015-12-21 MED ORDER — LITHIUM CARBONATE ER 300 MG PO TBCR: 300.0000 mg | EXTENDED_RELEASE_TABLET | Freq: Two times a day (BID) | ORAL | 0 refills | Status: DC

## 2015-12-21 NOTE — Telephone Encounter (Signed)
Medication refills - Pt. left message she is out of all medications and needs refills to last until rescheduled appt. 12/30/15. Last evaluated on 07/07/15, and was cancelled 10/13/15. Last refills 11/08/15.  Per Dr. Lolly MustacheArfeen, pt is authorized for refills for Klonopin 0.5mg , #60, Lithium 300mg , #60 and Prozac 20mg , #30. Klonopin Rx was phoned into pharmacy. Lithium and Prozac Rx's were sent electrically to pharmacy. Called and informed pt of refill status. Pt verbalizes understanding.  Pt is schedule for a f/u appt on 10/23.

## 2015-12-21 NOTE — Telephone Encounter (Signed)
Pt return call to office. Pt states she was been off Klonopin for 10 days. Pt states her anxiety is getting worse and would like to try Xanax. Informed pt a refill for klonopin was sent to pharmacy. Pt is schedule for a f/u appt on 10/23. Informed pt she could discuss medication changes with provider. Please advise.

## 2015-12-23 ENCOUNTER — Telehealth (HOSPITAL_COMMUNITY): Payer: Self-pay

## 2015-12-24 ENCOUNTER — Telehealth (HOSPITAL_COMMUNITY): Payer: Self-pay

## 2015-12-24 NOTE — Telephone Encounter (Signed)
Patient is calling because she said her anxiety is through the roof. We called in klonopin last week, patient states she is not taking that anymore - she said you told her in order to start on Xanax that she would have to be off the klonopin. Patient would like to do the Xanax, she says the klonopin does nothing for her and she sis not pick up prescription at the pharmacy (this was verified by this Clinical research associatewriter) Please review and advise, thank you

## 2015-12-28 ENCOUNTER — Other Ambulatory Visit (HOSPITAL_COMMUNITY): Payer: Self-pay

## 2015-12-28 ENCOUNTER — Telehealth (HOSPITAL_COMMUNITY): Payer: Self-pay

## 2015-12-28 MED ORDER — ALPRAZOLAM 0.5 MG PO TABS: 0.5000 mg | ORAL_TABLET | Freq: Two times a day (BID) | ORAL | 0 refills | Status: DC | PRN

## 2015-12-28 NOTE — Telephone Encounter (Signed)
I received a voicemail from patients friend Olegario MessierKathy threatening to have me fired if I did not help her friend and give her proper medication. I had called in a prescription of Xanax to the pharmacy per Dr. Lolly MustacheArfeen several hours before this message was left. I had let patient know via her voicemail. I called patient back and informed her that her medication was at the pharmacy and that I do not appreciate her friends calling to threaten me. Patient apologized and said that she did not know what else to do.

## 2015-12-28 NOTE — Telephone Encounter (Signed)
Discontinue Klonopin and start Xanax 0.5 mg twice a day as needed.

## 2015-12-28 NOTE — Progress Notes (Signed)
Discontinued the Klonopin and called in Xanax per Dr. Lolly MustacheArfeen

## 2015-12-30 ENCOUNTER — Ambulatory Visit (HOSPITAL_COMMUNITY): Payer: Self-pay | Admitting: Psychiatry
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
## 2016-01-17 ENCOUNTER — Other Ambulatory Visit (HOSPITAL_COMMUNITY): Payer: Self-pay

## 2016-01-17 ENCOUNTER — Ambulatory Visit (INDEPENDENT_AMBULATORY_CARE_PROVIDER_SITE_OTHER): Payer: Medicare Other | Admitting: Psychiatry

## 2016-01-17 DIAGNOSIS — F319 Bipolar disorder, unspecified: Secondary | ICD-10-CM

## 2016-01-17 DIAGNOSIS — Z79891 Long term (current) use of opiate analgesic: Secondary | ICD-10-CM | POA: Diagnosis not present

## 2016-01-17 DIAGNOSIS — Z79899 Other long term (current) drug therapy: Secondary | ICD-10-CM | POA: Diagnosis not present

## 2016-01-17 MED ORDER — LITHIUM CARBONATE ER 300 MG PO TBCR: 300.0000 mg | EXTENDED_RELEASE_TABLET | Freq: Two times a day (BID) | ORAL | 0 refills | Status: DC

## 2016-01-17 MED ORDER — FLUOXETINE HCL 40 MG PO CAPS: 40.0000 mg | ORAL_CAPSULE | Freq: Every day | ORAL | 0 refills | Status: DC

## 2016-01-17 MED ORDER — FLUOXETINE HCL 20 MG PO CAPS: 20.0000 mg | ORAL_CAPSULE | Freq: Every day | ORAL | 0 refills | Status: DC

## 2016-01-17 NOTE — Patient Instructions (Addendum)
1. Continue current medication 2. Return to clinic in one month   

## 2016-01-24 ENCOUNTER — Other Ambulatory Visit (HOSPITAL_COMMUNITY): Payer: Self-pay

## 2016-01-24 ENCOUNTER — Other Ambulatory Visit (HOSPITAL_COMMUNITY): Payer: Self-pay | Admitting: Psychiatry

## 2016-01-24 MED ORDER — ALPRAZOLAM 0.5 MG PO TABS: 0.5000 mg | ORAL_TABLET | Freq: Two times a day (BID) | ORAL | 0 refills | Status: DC | PRN

## 2016-02-23 ENCOUNTER — Telehealth (HOSPITAL_COMMUNITY): Payer: Self-pay

## 2016-02-23 ENCOUNTER — Ambulatory Visit (INDEPENDENT_AMBULATORY_CARE_PROVIDER_SITE_OTHER): Payer: Medicare Other | Admitting: Psychiatry

## 2016-02-23 ENCOUNTER — Encounter (HOSPITAL_COMMUNITY): Payer: Self-pay | Admitting: Psychiatry

## 2016-02-23 VITALS — BP 115/77 | HR 79 | Resp 12 | Ht 64.0 in | Wt 131.8 lb

## 2016-02-23 DIAGNOSIS — F319 Bipolar disorder, unspecified: Secondary | ICD-10-CM | POA: Diagnosis not present

## 2016-02-23 DIAGNOSIS — F4310 Post-traumatic stress disorder, unspecified: Secondary | ICD-10-CM

## 2016-02-23 DIAGNOSIS — F411 Generalized anxiety disorder: Secondary | ICD-10-CM | POA: Diagnosis not present

## 2016-02-23 DIAGNOSIS — Z79899 Other long term (current) drug therapy: Secondary | ICD-10-CM

## 2016-02-23 MED ORDER — HYDROXYZINE PAMOATE 25 MG PO CAPS: 25.0000 mg | ORAL_CAPSULE | Freq: Every day | ORAL | 1 refills | Status: DC | PRN

## 2016-02-23 MED ORDER — LITHIUM CARBONATE ER 450 MG PO TBCR: 450.0000 mg | EXTENDED_RELEASE_TABLET | Freq: Two times a day (BID) | ORAL | 2 refills | Status: DC

## 2016-02-23 MED ORDER — ALPRAZOLAM 0.5 MG PO TABS: 0.5000 mg | ORAL_TABLET | Freq: Two times a day (BID) | ORAL | 2 refills | Status: DC | PRN

## 2016-02-23 MED ORDER — FLUOXETINE HCL 40 MG PO CAPS: 40.0000 mg | ORAL_CAPSULE | Freq: Every day | ORAL | 2 refills | Status: DC

## 2016-02-23 NOTE — Telephone Encounter (Signed)
Medication management - [facility] for needed follow up appointment for patient per Dr. Lolly MustacheArfeen order.  Patient to call back if any problem with getting appointment.
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
## 2016-02-28 ENCOUNTER — Telehealth (HOSPITAL_COMMUNITY): Payer: Self-pay

## 2016-02-28 ENCOUNTER — Encounter: Payer: Self-pay | Admitting: Adult Health

## 2016-02-28 ENCOUNTER — Ambulatory Visit (INDEPENDENT_AMBULATORY_CARE_PROVIDER_SITE_OTHER): Payer: Medicare Other | Admitting: Adult Health

## 2016-02-28 VITALS — Ht 64.0 in | Wt 132.0 lb

## 2016-02-28 DIAGNOSIS — R410 Disorientation, unspecified: Secondary | ICD-10-CM | POA: Diagnosis not present

## 2016-02-28 DIAGNOSIS — R51 Headache: Secondary | ICD-10-CM | POA: Diagnosis not present

## 2016-02-28 DIAGNOSIS — G4700 Insomnia, unspecified: Secondary | ICD-10-CM

## 2016-02-28 DIAGNOSIS — R413 Other amnesia: Secondary | ICD-10-CM

## 2016-02-28 DIAGNOSIS — R9089 Other abnormal findings on diagnostic imaging of central nervous system: Secondary | ICD-10-CM

## 2016-02-28 DIAGNOSIS — G8929 Other chronic pain: Secondary | ICD-10-CM

## 2016-02-28 NOTE — Progress Notes (Signed)
I have read the note, and I agree with the clinical assessment and plan.  WILLIS,CHARLES KEITH   
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
## 2016-02-28 NOTE — Telephone Encounter (Signed)
Patient called, she had to stop taking the Hydroxyzine - she had a reaction to it. Patient states that she can not take any allergy medication. She said that she still needs something to help her sleep. Please review and advise, thank you.

## 2016-02-28 NOTE — Patient Instructions (Signed)
MRI brain If your symptoms worsen or you develop new symptoms please let us know.   

## 2016-02-29 MED ORDER — DOXEPIN HCL 10 MG PO CAPS: 10.0000 mg | ORAL_CAPSULE | Freq: Every day | ORAL | 0 refills | Status: DC

## 2016-02-29 NOTE — Telephone Encounter (Signed)
I returned patient's phone call.  She did not like Vistaril.  She endorse that cause increased heart rate.  She had never tried doxepin.  I discussed to try low-dose doxepin and call us back if she has any question or concern.  She agreed.

## 2016-03-14 ENCOUNTER — Ambulatory Visit
Admission: RE | Admit: 2016-03-14 | Discharge: 2016-03-14 | Disposition: A | Discharge status: Home / Self Care | Payer: Medicare Other | Source: Ambulatory Visit | Attending: Adult Health | Admitting: Adult Health

## 2016-03-14 DIAGNOSIS — R410 Disorientation, unspecified: Secondary | ICD-10-CM

## 2016-03-14 DIAGNOSIS — R51 Headache: Secondary | ICD-10-CM

## 2016-03-14 DIAGNOSIS — G8929 Other chronic pain: Secondary | ICD-10-CM

## 2016-03-14 DIAGNOSIS — R9089 Other abnormal findings on diagnostic imaging of central nervous system: Secondary | ICD-10-CM

## 2016-03-14 DIAGNOSIS — R413 Other amnesia: Secondary | ICD-10-CM

## 2016-03-14 MED ORDER — GADOBENATE DIMEGLUMINE 529 MG/ML IV SOLN
11.0000 mL | Freq: Once | INTRAVENOUS | Status: AC | PRN
  Administered 2016-03-14: 10 mL via INTRAVENOUS

## 2016-03-16 ENCOUNTER — Encounter: Payer: Self-pay | Admitting: *Deleted

## 2016-03-16 ENCOUNTER — Telehealth: Payer: Self-pay | Admitting: *Deleted

## 2016-03-16 DIAGNOSIS — R413 Other amnesia: Secondary | ICD-10-CM

## 2016-03-16 NOTE — Telephone Encounter (Signed)
LVM with number requesting call back for MRI results.
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
## 2016-03-16 NOTE — Telephone Encounter (Signed)
Per Dolores HooseM Millikan, NP, spoke with patient and informed her that her MRI brain results are unchanged from her previous MRI. Patient stated "Then why am I having these spells and can't remember anything?  What about the blood clots and mini strokes my doctor said I was having?"  This RN inquired which doctor she was referring; she stated, "Dr Anne HahnWillis". This RN reviewed Jearld FentonM Millikan's office note from 02/28/16, but was unable to satisfactorily answer the patient 's questions.  Patient requested that Dolores HooseM Millikan call her to discuss. Routed note to NP.

## 2016-03-16 NOTE — Addendum Note (Signed)
Addended by: Enedina FinnerMILLIKAN, Remmi Armenteros P on: 03/16/2016 04:18 PM   Modules accepted: Orders

## 2016-04-02 DIAGNOSIS — Z01419 Encounter for gynecological examination (general) (routine) without abnormal findings: Secondary | ICD-10-CM | POA: Diagnosis not present

## 2016-04-03 ENCOUNTER — Other Ambulatory Visit (HOSPITAL_COMMUNITY): Payer: Self-pay | Admitting: Psychiatry

## 2016-04-03 DIAGNOSIS — G4700 Insomnia, unspecified: Secondary | ICD-10-CM

## 2016-04-10 ENCOUNTER — Other Ambulatory Visit (HOSPITAL_COMMUNITY): Payer: Self-pay | Admitting: Psychiatry

## 2016-04-10 DIAGNOSIS — G4700 Insomnia, unspecified: Secondary | ICD-10-CM

## 2016-04-10 MED ORDER — DOXEPIN HCL 10 MG PO CAPS: 10.0000 mg | ORAL_CAPSULE | Freq: Every day | ORAL | 0 refills | Status: DC

## 2016-04-11 ENCOUNTER — Emergency Department (HOSPITAL_COMMUNITY)
Admission: EM | Admit: 2016-04-11 | Discharge: 2016-04-11 | Disposition: A | Discharge status: Home / Self Care | Payer: Medicare Other | Attending: Emergency Medicine | Admitting: Emergency Medicine

## 2016-04-11 ENCOUNTER — Encounter (HOSPITAL_COMMUNITY): Payer: Self-pay | Admitting: Emergency Medicine

## 2016-04-11 DIAGNOSIS — J09X2 Influenza due to identified novel influenza A virus with other respiratory manifestations: Secondary | ICD-10-CM | POA: Diagnosis not present

## 2016-04-11 DIAGNOSIS — F17210 Nicotine dependence, cigarettes, uncomplicated: Secondary | ICD-10-CM | POA: Diagnosis not present

## 2016-04-11 DIAGNOSIS — Z79899 Other long term (current) drug therapy: Secondary | ICD-10-CM | POA: Insufficient documentation

## 2016-04-11 DIAGNOSIS — R112 Nausea with vomiting, unspecified: Secondary | ICD-10-CM | POA: Diagnosis not present

## 2016-04-11 DIAGNOSIS — J101 Influenza due to other identified influenza virus with other respiratory manifestations: Secondary | ICD-10-CM

## 2016-04-11 DIAGNOSIS — R000 Tachycardia, unspecified: Secondary | ICD-10-CM | POA: Diagnosis not present

## 2016-04-11 LAB — CBC WITH DIFFERENTIAL/PLATELET
BASOS ABS: 0 10*3/uL (ref 0.0–0.1)
BASOS PCT: 0 %
EOS ABS: 0 10*3/uL (ref 0.0–0.7)
Eosinophils Relative: 0 %
HCT: 39.6 % (ref 36.0–46.0)
Hemoglobin: 13.3 g/dL (ref 12.0–15.0)
Lymphocytes Relative: 4 %
Lymphs Abs: 0.3 10*3/uL — ABNORMAL LOW (ref 0.7–4.0)
MCH: 30.4 pg (ref 26.0–34.0)
MCHC: 33.6 g/dL (ref 30.0–36.0)
MCV: 90.6 fL (ref 78.0–100.0)
MONOS PCT: 4 %
Monocytes Absolute: 0.3 10*3/uL (ref 0.1–1.0)
NEUTROS PCT: 92 %
Neutro Abs: 5.8 10*3/uL (ref 1.7–7.7)
Platelets: 234 10*3/uL (ref 150–400)
RBC: 4.37 MIL/uL (ref 3.87–5.11)
RDW: 12.8 % (ref 11.5–15.5)
WBC: 6.4 10*3/uL (ref 4.0–10.5)

## 2016-04-11 LAB — COMPREHENSIVE METABOLIC PANEL
ALK PHOS: 78 U/L (ref 38–126)
ALT: 19 U/L (ref 14–54)
AST: 20 U/L (ref 15–41)
Albumin: 4.6 g/dL (ref 3.5–5.0)
Anion gap: 9 (ref 5–15)
BILIRUBIN TOTAL: 0.3 mg/dL (ref 0.3–1.2)
BUN: 12 mg/dL (ref 6–20)
CALCIUM: 8.7 mg/dL — AB (ref 8.9–10.3)
CO2: 23 mmol/L (ref 22–32)
CREATININE: 0.73 mg/dL (ref 0.44–1.00)
Chloride: 105 mmol/L (ref 101–111)
Glucose, Bld: 117 mg/dL — ABNORMAL HIGH (ref 65–99)
Potassium: 3.4 mmol/L — ABNORMAL LOW (ref 3.5–5.1)
Sodium: 137 mmol/L (ref 135–145)
TOTAL PROTEIN: 7.4 g/dL (ref 6.5–8.1)

## 2016-04-11 LAB — URINALYSIS, ROUTINE W REFLEX MICROSCOPIC
Bilirubin Urine: NEGATIVE
GLUCOSE, UA: NEGATIVE mg/dL
KETONES UR: 5 mg/dL — AB
Leukocytes, UA: NEGATIVE
NITRITE: NEGATIVE
PROTEIN: NEGATIVE mg/dL
Specific Gravity, Urine: 1.011 (ref 1.005–1.030)
pH: 6 (ref 5.0–8.0)

## 2016-04-11 LAB — I-STAT BETA HCG BLOOD, ED (MC, WL, AP ONLY): I-stat hCG, quantitative: <5 m[IU]/mL (ref <5)

## 2016-04-11 LAB — INFLUENZA PANEL BY PCR (TYPE A & B)
Influenza A By PCR: POSITIVE — AB
Influenza B By PCR: NEGATIVE

## 2016-04-11 LAB — I-STAT CG4 LACTIC ACID, ED: Lactic Acid, Venous: 1.19 mmol/L (ref 0.5–1.9)

## 2016-04-11 LAB — LIPASE, BLOOD: Lipase: 20 U/L (ref 11–51)

## 2016-04-11 MED ORDER — HYDROCODONE-ACETAMINOPHEN 5-325 MG PO TABS: ORAL_TABLET | ORAL | 0 refills | Status: DC

## 2016-04-11 MED ORDER — IBUPROFEN 200 MG PO TABS
400.0000 mg | ORAL_TABLET | Freq: Once | ORAL | Status: AC
  Administered 2016-04-11: 400 mg via ORAL
  Filled 2016-04-11: qty 2

## 2016-04-11 MED ORDER — ONDANSETRON HCL 4 MG PO TABS: 4.0000 mg | ORAL_TABLET | Freq: Three times a day (TID) | ORAL | 0 refills | Status: DC | PRN

## 2016-04-11 MED ORDER — ONDANSETRON HCL 4 MG/2ML IJ SOLN
4.0000 mg | Freq: Once | INTRAMUSCULAR | Status: AC
  Administered 2016-04-11: 4 mg via INTRAVENOUS
  Filled 2016-04-11: qty 2

## 2016-04-11 MED ORDER — SODIUM CHLORIDE 0.9 % IV BOLUS (SEPSIS)
1000.0000 mL | Freq: Once | INTRAVENOUS | Status: AC
  Administered 2016-04-11: 1000 mL via INTRAVENOUS

## 2016-04-11 MED ORDER — PROMETHAZINE HCL 25 MG RE SUPP: 25.0000 mg | Freq: Four times a day (QID) | RECTAL | 0 refills | Status: DC | PRN

## 2016-04-11 MED ORDER — KETOROLAC TROMETHAMINE 30 MG/ML IJ SOLN
15.0000 mg | Freq: Once | INTRAMUSCULAR | Status: AC
  Administered 2016-04-11: 15 mg via INTRAVENOUS
  Filled 2016-04-11: qty 1

## 2016-04-11 NOTE — ED Notes (Signed)
Pt reports nausea, vomiting, fever, body aches, and chills for the last 24 hr. Pt reports room mates have been dx with flu recently. Pt states having yellow emesis for the last 24 hr and unable to keep fluids down. IV fluids currently infusing and Zofran administered. Currently awaiting lab results and disposition.

## 2016-04-11 NOTE — Discharge Instructions (Signed)

## 2016-04-11 NOTE — ED Provider Notes (Signed)
[facility]-EMERGENCY DEPT Provider Note   CSN: [id number] Arrival date & time: 04/11/16  0139  By signing my name below, I, Modena Jansky, attest that this documentation has been prepared under the direction and in the presence of non-physician practitioner, Wynetta Emery, PA. Electronically Signed: Modena Jansky, Scribe. 04/11/2016. 1:58 AM.  History   Chief Complaint Chief Complaint  Patient presents with  . Vomiting   The history is provided by the patient. No language interpreter was used.   HPI Comments: Kimberly Lynch is a 31 y.o. female who presents to the Emergency Department complaining of vomiting that started yesterday. She states she had 20 episodes of vomiting. She describes the emesis as yellow. No modifying factors. She reports associated subjective fever, headache, generalized myalgias, rhinorrhea, SOB, and nausea. Pt's temperature in the ED today was 100.5. She admits to sick contacts. She denies any abdominal pain, diarrhea, chest pain, other complaints.   Past Medical History:  Diagnosis Date  . Anxiety   . Arthritis   . Bipolar 1 disorder (HCC)   . Depression   . Headache 04/20/2015  . Near syncope 02/03/2015  . Panic   . Scoliosis     Patient Active Problem List   Diagnosis Date Noted  . Bipolar 1 disorder (HCC) 01/17/2016  . Biliary dyskinesia   . Generalized abdominal cramping   . Cholelithiases 05/17/2015  . Acute cholecystitis 05/17/2015  . RUQ pain   . Headache 04/20/2015  . Near syncope 02/03/2015    Past Surgical History:  Procedure Laterality Date  . CHOLECYSTECTOMY N/A 05/20/2015   Procedure: LAPAROSCOPIC CHOLECYSTECTOMY WITH ATTEMPTED INTRAOPERATIVE CHOLANGIOGRAM;  Surgeon: Violeta Gelinas, MD;  Location: [facility] OR;  Service: General;  Laterality: N/A;  . FRACTURE SURGERY    . TUBAL LIGATION      OB History    No data available       Home Medications    Prior to Admission medications   Medication Sig Start Date End Date Taking?  Authorizing Provider  ALPRAZolam Prudy Feeler) 0.5 MG tablet Take 1 tablet (0.5 mg total) by mouth 2 (two) times daily as needed for anxiety. 02/23/16 02/22/17 Yes Cleotis Nipper, MD  dextromethorphan-guaiFENesin (MUCINEX DM) 30-600 MG 12hr tablet Take 1 tablet by mouth 2 (two) times daily as needed for cough.   Yes Historical Provider, MD  doxepin (SINEQUAN) 10 MG capsule Take 1 capsule (10 mg total) by mouth at bedtime. 04/10/16 04/10/17 Yes Cleotis Nipper, MD  FLUoxetine (PROZAC) 40 MG capsule Take 1 capsule (40 mg total) by mouth daily. 02/23/16 02/22/17 Yes Cleotis Nipper, MD  ibuprofen (ADVIL,MOTRIN) 200 MG tablet Take 200 mg by mouth every 6 (six) hours as needed for moderate pain.    Yes Historical Provider, MD  lithium carbonate (ESKALITH) 450 MG CR tablet Take 1 tablet (450 mg total) by mouth 2 (two) times daily. 02/23/16  Yes Cleotis Nipper, MD  HYDROcodone-acetaminophen (NORCO/VICODIN) 5-325 MG tablet Take 1-2 tablets by mouth every 6 hours as needed for pain and/or cough. 04/11/16   Jolan Upchurch, PA-C  ondansetron (ZOFRAN) 4 MG tablet Take 1 tablet (4 mg total) by mouth every 8 (eight) hours as needed for nausea or vomiting. 04/11/16   Paige Vanderwoude, PA-C  promethazine (PHENERGAN) 25 MG suppository Place 1 suppository (25 mg total) rectally every 6 (six) hours as needed for nausea or vomiting. 04/11/16   Joni Reining Gabe Glace, PA-C    Family History Family History  Problem Relation Age of Onset  . Adopted: Yes  .  Cancer Other   . Diabetes Other     Social History Social History  Substance Use Topics  . Smoking status: Current Every Day Smoker    Packs/day: 0.40    Years: 22.00    Types: Cigarettes  . Smokeless tobacco: Never Used  . Alcohol use No     Comment: Previous drinker. Quit 6 months ago     Allergies   Other; Vistaril [hydroxyzine]; and Chlorhexidine   Review of Systems Review of Systems A complete 10 system review of systems was obtained and all systems are negative  except as noted in the HPI and PMH.   Physical Exam Updated Vital Signs BP 107/72 (BP Location: Left Arm)   Pulse 102   Temp 100.5 F (38.1 C) (Oral)   Resp 20   Ht 5\' 4"  (1.626 m)   Wt 130 lb (59 kg)   LMP 04/01/2016   SpO2 97%   BMI 22.31 kg/m   Physical Exam  Constitutional: She appears well-developed and well-nourished.  HENT:  Head: Normocephalic.  Right Ear: External ear normal.  Left Ear: External ear normal.  Mouth/Throat: Oropharynx is clear and moist. No oropharyngeal exudate.  No drooling or stridor. Posterior pharynx mildly erythematous no significant tonsillar hypertrophy. No exudate. Soft palate rises symmetrically. No TTP or induration under tongue.   No tenderness to palpation of frontal or bilateral maxillary sinuses.  Mild mucosal edema in the nares with scant rhinorrhea.  Bilateral tympanic membranes with normal architecture and good light reflex.    Eyes: Conjunctivae and EOM are normal. Pupils are equal, round, and reactive to light.  Neck: Normal range of motion. Neck supple.  Cardiovascular: Normal rate and regular rhythm.   Pulmonary/Chest: Effort normal and breath sounds normal. No stridor. No respiratory distress. She has no wheezes. She has no rales. She exhibits no tenderness.  Abdominal: Soft. There is no tenderness. There is no rebound and no guarding.  Nursing note and vitals reviewed.    ED Treatments / Results  DIAGNOSTIC STUDIES: Oxygen Saturation is 97% on RA, normal by my interpretation.    COORDINATION OF CARE: 2:02 AM- Pt advised of plan for treatment and pt agrees.  Labs (all labs ordered are listed, but only abnormal results are displayed) Labs Reviewed  COMPREHENSIVE METABOLIC PANEL - Abnormal; Notable for the following:       Result Value   Potassium 3.4 (*)    Glucose, Bld 117 (*)    Calcium 8.7 (*)    All other components within normal limits  CBC WITH DIFFERENTIAL/PLATELET - Abnormal; Notable for the following:     Lymphs Abs 0.3 (*)    All other components within normal limits  URINALYSIS, ROUTINE W REFLEX MICROSCOPIC - Abnormal; Notable for the following:    APPearance HAZY (*)    Hgb urine dipstick MODERATE (*)    Ketones, ur 5 (*)    Bacteria, UA RARE (*)    Squamous Epithelial / LPF 6-30 (*)    All other components within normal limits  INFLUENZA PANEL BY PCR (TYPE A & B) - Abnormal; Notable for the following:    Influenza A By PCR POSITIVE (*)    All other components within normal limits  CULTURE, BLOOD (ROUTINE X 2)  CULTURE, BLOOD (ROUTINE X 2)  URINE CULTURE  LIPASE, BLOOD  I-STAT CG4 LACTIC ACID, ED  I-STAT BETA HCG BLOOD, ED ([facility], [facility], [facility] ONLY)  I-STAT CG4 LACTIC ACID, ED    EKG  EKG Interpretation None  Radiology No results found.  Procedures Procedures (including critical care time)  Medications Ordered in ED Medications  sodium chloride 0.9 % bolus 1,000 mL (0 mLs Intravenous Stopped 04/11/16 0325)    And  sodium chloride 0.9 % bolus 1,000 mL (0 mLs Intravenous Stopped 04/11/16 0410)  ondansetron (ZOFRAN) injection 4 mg (4 mg Intravenous Given 04/11/16 0236)  ibuprofen (ADVIL,MOTRIN) tablet 400 mg (400 mg Oral Given 04/11/16 0250)  ketorolac (TORADOL) 30 MG/ML injection 15 mg (15 mg Intravenous Given 04/11/16 0345)     Initial Impression / Assessment and Plan / ED Course  I have reviewed the triage vital signs and the nursing notes.  Pertinent labs & imaging results that were available during my care of the patient were reviewed by me and considered in my medical decision making (see chart for details).  Clinical Course     Vitals:   04/11/16 0253 04/11/16 0326 04/11/16 0330 04/11/16 0439  BP: 105/77 113/80 120/75 96/73  Pulse: 105 109 109 101  Resp: 22 19 (!) 31 18  Temp:  101.2 F (38.4 C)  99.8 F (37.7 C)  TempSrc:  Oral  Oral  SpO2: 99% 95% 94% 96%  Weight:      Height:        Medications  sodium chloride 0.9 % bolus 1,000 mL (0 mLs  Intravenous Stopped 04/11/16 0325)    And  sodium chloride 0.9 % bolus 1,000 mL (0 mLs Intravenous Stopped 04/11/16 0410)  ondansetron (ZOFRAN) injection 4 mg (4 mg Intravenous Given 04/11/16 0236)  ibuprofen (ADVIL,MOTRIN) tablet 400 mg (400 mg Oral Given 04/11/16 0250)  ketorolac (TORADOL) 30 MG/ML injection 15 mg (15 mg Intravenous Given 04/11/16 0345)    Kimberly Lynch is 31 y.o. female presenting with Influenza-like illness onset over the last 48 hours. She is nontoxic appearing, vital signs with fever and mild tachycardia. Will check classic acid and basic blood work. She is denying any chest pain, shortness of breath or productive cough, doubt she has a different poses a bacterial pneumonia.  Blood work reassuring with a normal lactic acid level, normal white count. She is improved after fluids, tolerating by mouth's. We have discussed the pros and cons of initiating Tamiflu and ensure decision-making she declines. We've had an extensive discussion of return precautions and infection control techniques and patient verbalized understanding.  Evaluation does not show pathology that would require ongoing emergent intervention or inpatient treatment. Pt is hemodynamically stable and mentating appropriately. Discussed findings and plan with patient/guardian, who agrees with care plan. All questions answered. Return precautions discussed and outpatient follow up given.      Final Clinical Impressions(s) / ED Diagnoses   Final diagnoses:  Influenza A    New Prescriptions Discharge Medication List as of 04/11/2016  4:55 AM    START taking these medications   Details  HYDROcodone-acetaminophen (NORCO/VICODIN) 5-325 MG tablet Take 1-2 tablets by mouth every 6 hours as needed for pain and/or cough., Print    ondansetron (ZOFRAN) 4 MG tablet Take 1 tablet (4 mg total) by mouth every 8 (eight) hours as needed for nausea or vomiting., Starting Tue 04/11/2016, Print    promethazine (PHENERGAN) 25 MG  suppository Place 1 suppository (25 mg total) rectally every 6 (six) hours as needed for nausea or vomiting., Starting Tue 04/11/2016, Print       I personally performed the services described in this documentation, which was scribed in my presence. The recorded information has been reviewed and is accurate.  Wynetta Emery, PA-C 04/11/16 [id number]    Paula Libra, MD 04/11/16 [phone]

## 2016-04-11 NOTE — ED Notes (Signed)
Pt ambulated to restroom to obtain urine sample.

## 2016-04-11 NOTE — ED Triage Notes (Signed)
Pt presents by EMS for evaluation of nausea and vomiting since yesterday. Pt states she has yellow emesis along with fever. Pt coughing during triage. Pt states that where she live persons have had the Flu.

## 2016-04-11 NOTE — ED Notes (Signed)
FIRST SET OF BLOOD CULTURES OBTAINED

## 2016-04-11 NOTE — ED Notes (Signed)
Bed: EA54WA13 Expected date:  Expected time:  Means of arrival:  Comments: EMS 31 yo female from home nausea and vomiting x 3 days

## 2016-04-11 NOTE — ED Notes (Signed)
Second set of blood cultures obtained 

## 2016-04-12 LAB — URINE CULTURE

## 2016-04-14 ENCOUNTER — Telehealth (HOSPITAL_COMMUNITY): Payer: Self-pay

## 2016-04-14 NOTE — Telephone Encounter (Signed)
If she has vomiting, diarrhea and feeling very sick then she can stop lithium until her symptoms resolved.

## 2016-04-14 NOTE — Telephone Encounter (Signed)
Patient is calling about her medication. She said that she has been diagnosed with the flu and would like to know if she can still take her lithium. Please advise, thank you

## 2016-04-14 NOTE — Telephone Encounter (Signed)
Called patient back and left message letting her know that it would be okay to take as long as she is not having any vomiting or diarrhea,

## 2016-04-16 LAB — CULTURE, BLOOD (ROUTINE X 2)
Culture: NO GROWTH
Culture: NO GROWTH

## 2016-04-18 ENCOUNTER — Encounter (HOSPITAL_COMMUNITY): Payer: Self-pay

## 2016-04-18 ENCOUNTER — Emergency Department (HOSPITAL_COMMUNITY): Payer: Medicare Other

## 2016-04-18 ENCOUNTER — Emergency Department (HOSPITAL_COMMUNITY)
Admission: EM | Admit: 2016-04-18 | Discharge: 2016-04-18 | Disposition: A | Discharge status: Home / Self Care | Payer: Medicare Other | Attending: Emergency Medicine | Admitting: Emergency Medicine

## 2016-04-18 DIAGNOSIS — R05 Cough: Secondary | ICD-10-CM | POA: Diagnosis not present

## 2016-04-18 DIAGNOSIS — R079 Chest pain, unspecified: Secondary | ICD-10-CM | POA: Diagnosis not present

## 2016-04-18 DIAGNOSIS — J181 Lobar pneumonia, unspecified organism: Secondary | ICD-10-CM | POA: Diagnosis not present

## 2016-04-18 DIAGNOSIS — J111 Influenza due to unidentified influenza virus with other respiratory manifestations: Secondary | ICD-10-CM | POA: Diagnosis present

## 2016-04-18 DIAGNOSIS — Z79899 Other long term (current) drug therapy: Secondary | ICD-10-CM | POA: Insufficient documentation

## 2016-04-18 DIAGNOSIS — J189 Pneumonia, unspecified organism: Secondary | ICD-10-CM | POA: Diagnosis not present

## 2016-04-18 DIAGNOSIS — F17210 Nicotine dependence, cigarettes, uncomplicated: Secondary | ICD-10-CM | POA: Diagnosis not present

## 2016-04-18 LAB — URINALYSIS, ROUTINE W REFLEX MICROSCOPIC
BILIRUBIN URINE: NEGATIVE
Glucose, UA: NEGATIVE mg/dL
Hgb urine dipstick: NEGATIVE
KETONES UR: 80 mg/dL — AB
Nitrite: NEGATIVE
Protein, ur: 100 mg/dL — AB
SPECIFIC GRAVITY, URINE: 1.02 (ref 1.005–1.030)
pH: 6 (ref 5.0–8.0)

## 2016-04-18 LAB — PREGNANCY, URINE: PREG TEST UR: NEGATIVE

## 2016-04-18 MED ORDER — LEVOFLOXACIN 750 MG PO TABS
750.0000 mg | ORAL_TABLET | Freq: Once | ORAL | Status: AC
Start: 2016-04-18 — End: 2016-04-18
  Administered 2016-04-18: 750 mg via ORAL
  Filled 2016-04-18: qty 1

## 2016-04-18 MED ORDER — IBUPROFEN 400 MG PO TABS
400.0000 mg | ORAL_TABLET | Freq: Once | ORAL | Status: AC | PRN
  Administered 2016-04-18: 400 mg via ORAL

## 2016-04-18 MED ORDER — IBUPROFEN 400 MG PO TABS
ORAL_TABLET | ORAL | Status: AC
  Filled 2016-04-18: qty 1

## 2016-04-18 MED ORDER — ALBUTEROL SULFATE HFA 108 (90 BASE) MCG/ACT IN AERS
2.0000 | INHALATION_SPRAY | Freq: Once | RESPIRATORY_TRACT | Status: AC
  Administered 2016-04-18: 2 via RESPIRATORY_TRACT
  Filled 2016-04-18: qty 6.7

## 2016-04-18 MED ORDER — HYDROCODONE-ACETAMINOPHEN 7.5-325 MG/15ML PO SOLN: 10.0000 mL | Freq: Four times a day (QID) | ORAL | 0 refills | Status: DC | PRN

## 2016-04-18 MED ORDER — ALBUTEROL SULFATE HFA 108 (90 BASE) MCG/ACT IN AERS: 1.0000 | INHALATION_SPRAY | Freq: Four times a day (QID) | RESPIRATORY_TRACT | 0 refills | Status: DC | PRN

## 2016-04-18 MED ORDER — LEVOFLOXACIN 500 MG PO TABS: 500.0000 mg | ORAL_TABLET | Freq: Every day | ORAL | 0 refills | Status: DC

## 2016-04-18 NOTE — ED Triage Notes (Signed)
Pt was diagnosed with Influenza A 04-11-16.  Pt cold/cough symptoms are worsening.  Onset days ago diarrhea x 2 today, loose and yellow.  Pt has no appetite.  Is drinking fluids.  Pt feels she is getting worse.

## 2016-04-18 NOTE — ED Notes (Signed)
Pt verbalized understanding of d/c instructions and has no further questions. Pt is stable, A&Ox4, VSS.  

## 2016-04-18 NOTE — ED Notes (Signed)
Pt last took Ibuprofen 2 days ago.

## 2016-04-18 NOTE — Discharge Instructions (Signed)
Rest, push fluids to stay hydrated.  Take antibiotic as prescribed.  Follow-up with primary care. You will require repeat chest x-ray after resolution of your symptoms to ensure that your x-ray returns to normal.

## 2016-04-18 NOTE — ED Notes (Signed)
Patient transported to X-ray 

## 2016-04-18 NOTE — ED Provider Notes (Signed)
[facility]-EMERGENCY DEPT Provider Note   CSN: [id number] Arrival date & time: 04/18/16  1423   By signing my name below, I, Teofilo Pod, attest that this documentation has been prepared under the direction and in the presence of Rolland Porter, MD . Electronically Signed: Teofilo Pod, ED Scribe. 04/18/2016. 5:50 PM.   History   Chief Complaint Chief Complaint  Patient presents with  . Influenza    The history is provided by the patient. No language interpreter was used.  HPI Comments:  Kimberly Lynch is a 31 y.o. female who presents to the Emergency Department complaining of worsening influenza symptoms x 1 weeks. Pt was diagnosed with influenza A on 04/11/16 at [facility] ED. She treated there with pain medication, and was not given tamiflu. Pt complains of associated chest pain, cough, diarrhea x 2 days, decreased appetite. She states that "she has not really eaten in 1 week." She reports sick contact, and notes that person she was in contact with who had the flu now has pneumonia. No alleviating factors noted. Pt denies other associated symptoms.     Past Medical History:  Diagnosis Date  . Anxiety   . Arthritis   . Bipolar 1 disorder (HCC)   . Depression   . Headache 04/20/2015  . Near syncope 02/03/2015  . Panic   . Scoliosis     Patient Active Problem List   Diagnosis Date Noted  . Bipolar 1 disorder (HCC) 01/17/2016  . Biliary dyskinesia   . Generalized abdominal cramping   . Cholelithiases 05/17/2015  . Acute cholecystitis 05/17/2015  . RUQ pain   . Headache 04/20/2015  . Near syncope 02/03/2015    Past Surgical History:  Procedure Laterality Date  . CHOLECYSTECTOMY N/A 05/20/2015   Procedure: LAPAROSCOPIC CHOLECYSTECTOMY WITH ATTEMPTED INTRAOPERATIVE CHOLANGIOGRAM;  Surgeon: Violeta Gelinas, MD;  Location: [facility] OR;  Service: General;  Laterality: N/A;  . FRACTURE SURGERY    . TUBAL LIGATION      OB History    No data available       Home  Medications    Prior to Admission medications   Medication Sig Start Date End Date Taking? Authorizing Provider  albuterol (PROVENTIL HFA;VENTOLIN HFA) 108 (90 Base) MCG/ACT inhaler Inhale 1-2 puffs into the lungs every 6 (six) hours as needed for wheezing. 04/18/16   Rolland Porter, MD  ALPRAZolam Prudy Feeler) 0.5 MG tablet Take 1 tablet (0.5 mg total) by mouth 2 (two) times daily as needed for anxiety. 02/23/16 02/22/17  Cleotis Nipper, MD  dextromethorphan-guaiFENesin (MUCINEX DM) 30-600 MG 12hr tablet Take 1 tablet by mouth 2 (two) times daily as needed for cough.    Historical Provider, MD  doxepin (SINEQUAN) 10 MG capsule Take 1 capsule (10 mg total) by mouth at bedtime. 04/10/16 04/10/17  Cleotis Nipper, MD  FLUoxetine (PROZAC) 40 MG capsule Take 1 capsule (40 mg total) by mouth daily. 02/23/16 02/22/17  Cleotis Nipper, MD  HYDROcodone-acetaminophen (HYCET) 7.5-325 mg/15 ml solution Take 10 mLs by mouth 4 (four) times daily as needed for moderate pain. 04/18/16   Rolland Porter, MD  ibuprofen (ADVIL,MOTRIN) 200 MG tablet Take 200 mg by mouth every 6 (six) hours as needed for moderate pain.     Historical Provider, MD  levofloxacin (LEVAQUIN) 500 MG tablet Take 1 tablet (500 mg total) by mouth daily. 04/18/16   Rolland Porter, MD  lithium carbonate (ESKALITH) 450 MG CR tablet Take 1 tablet (450 mg total) by mouth 2 (two)  times daily. 02/23/16   Cleotis NipperSyed T Arfeen, MD  ondansetron (ZOFRAN) 4 MG tablet Take 1 tablet (4 mg total) by mouth every 8 (eight) hours as needed for nausea or vomiting. 04/11/16   Nicole Pisciotta, PA-C  promethazine (PHENERGAN) 25 MG suppository Place 1 suppository (25 mg total) rectally every 6 (six) hours as needed for nausea or vomiting. 04/11/16   Joni ReiningNicole Pisciotta, PA-C    Family History Family History  Problem Relation Age of Onset  . Adopted: Yes  . Cancer Other   . Diabetes Other     Social History Social History  Substance Use Topics  . Smoking status: Current Every Day Smoker     Packs/day: 0.40    Years: 22.00    Types: Cigarettes  . Smokeless tobacco: Never Used  . Alcohol use No     Comment: Previous drinker. Quit 6 months ago     Allergies   Other; Vistaril [hydroxyzine]; and Chlorhexidine   Review of Systems Review of Systems  Constitutional: Positive for appetite change. Negative for chills, diaphoresis, fatigue and fever.  HENT: Negative for mouth sores, sore throat and trouble swallowing.   Eyes: Negative for visual disturbance.  Respiratory: Positive for cough. Negative for chest tightness, shortness of breath and wheezing.   Cardiovascular: Positive for chest pain.  Gastrointestinal: Positive for diarrhea. Negative for abdominal distention, abdominal pain, nausea and vomiting.  Endocrine: Negative for polydipsia, polyphagia and polyuria.  Genitourinary: Negative for dysuria, frequency and hematuria.  Musculoskeletal: Negative for gait problem.  Skin: Negative for color change, pallor and rash.  Neurological: Negative for dizziness, syncope, light-headedness and headaches.  Hematological: Does not bruise/bleed easily.  Psychiatric/Behavioral: Negative for behavioral problems and confusion.  All other systems reviewed and are negative.    Physical Exam Updated Vital Signs BP 113/88   Pulse 73   Temp 98.5 F (36.9 C) (Oral)   Resp 15   LMP 04/01/2016   SpO2 99%   Physical Exam  Constitutional: She is oriented to person, place, and time. She appears well-developed and well-nourished. No distress.  HENT:  Head: Normocephalic.  White, thick coating on the tongue.  Eyes: Conjunctivae are normal. Pupils are equal, round, and reactive to light. No scleral icterus.  Neck: Normal range of motion. Neck supple. No thyromegaly present.  Cardiovascular: Normal rate and regular rhythm.  Exam reveals no gallop and no friction rub.   No murmur heard. Pulmonary/Chest: Effort normal and breath sounds normal. No respiratory distress. She has no  wheezes. She has no rales.  Abdominal: Soft. Bowel sounds are normal. She exhibits no distension. There is no tenderness. There is no rebound.  Musculoskeletal: Normal range of motion.  Lymphadenopathy:    She has cervical adenopathy.  Neurological: She is alert and oriented to person, place, and time.  Skin: Skin is warm and dry. No rash noted.  Psychiatric: She has a normal mood and affect. Her behavior is normal.     ED Treatments / Results  DIAGNOSTIC STUDIES:  Oxygen Saturation is 97% on RA, normal by my interpretation.    COORDINATION OF CARE:  5:47 PM Will order inhaler and chest xray. Discussed treatment plan with pt at bedside and pt agreed to plan.   Labs (all labs ordered are listed, but only abnormal results are displayed) Labs Reviewed  URINALYSIS, ROUTINE W REFLEX MICROSCOPIC - Abnormal; Notable for the following:       Result Value   Color, Urine AMBER (*)    APPearance CLOUDY (*)  Ketones, ur 80 (*)    Protein, ur 100 (*)    Leukocytes, UA TRACE (*)    Bacteria, UA RARE (*)    Squamous Epithelial / LPF 6-30 (*)    All other components within normal limits  PREGNANCY, URINE    EKG  EKG Interpretation None       Radiology No results found.  Procedures Procedures (including critical care time)  Medications Ordered in ED Medications  ibuprofen (ADVIL,MOTRIN) tablet 400 mg (400 mg Oral Given 04/18/16 1534)  albuterol (PROVENTIL HFA;VENTOLIN HFA) 108 (90 Base) MCG/ACT inhaler 2 puff (2 puffs Inhalation Given 04/18/16 1802)  levofloxacin (LEVAQUIN) tablet 750 mg (750 mg Oral Given 04/18/16 1940)     Initial Impression / Assessment and Plan / ED Course  I have reviewed the triage vital signs and the nursing notes.  Pertinent labs & imaging results that were available during my care of the patient were reviewed by me and considered in my medical decision making (see chart for details).     Patient with subtle finding of tone or infiltrate. Overall  appears well. Not hypoxemic. Tolerating by mouth. Procrit for discharge home. Will treat with Levaquin, cough suppressants, albuterol.  Final Clinical Impressions(s) / ED Diagnoses   Final diagnoses:  Community acquired pneumonia of right middle lobe of lung (HCC)    New Prescriptions Discharge Medication List as of 04/18/2016  7:14 PM    START taking these medications   Details  albuterol (PROVENTIL HFA;VENTOLIN HFA) 108 (90 Base) MCG/ACT inhaler Inhale 1-2 puffs into the lungs every 6 (six) hours as needed for wheezing., Starting Tue 04/18/2016, Print    HYDROcodone-acetaminophen (HYCET) 7.5-325 mg/15 ml solution Take 10 mLs by mouth 4 (four) times daily as needed for moderate pain., Starting Tue 04/18/2016, Print    levofloxacin (LEVAQUIN) 500 MG tablet Take 1 tablet (500 mg total) by mouth daily., Starting Tue 04/18/2016, Print      I personally performed the services described in this documentation, which was scribed in my presence. The recorded information has been reviewed and is accurate.     Rolland Porter, MD 04/27/16 [phone]

## 2016-05-03 DIAGNOSIS — Z7251 High risk heterosexual behavior: Secondary | ICD-10-CM | POA: Insufficient documentation

## 2016-05-03 DIAGNOSIS — M2550 Pain in unspecified joint: Secondary | ICD-10-CM | POA: Diagnosis not present

## 2016-05-03 DIAGNOSIS — R350 Frequency of micturition: Secondary | ICD-10-CM | POA: Diagnosis not present

## 2016-05-03 DIAGNOSIS — R5381 Other malaise: Secondary | ICD-10-CM | POA: Diagnosis not present

## 2016-05-03 DIAGNOSIS — K089 Disorder of teeth and supporting structures, unspecified: Secondary | ICD-10-CM | POA: Diagnosis not present

## 2016-05-03 DIAGNOSIS — Z13220 Encounter for screening for lipoid disorders: Secondary | ICD-10-CM | POA: Diagnosis not present

## 2016-05-03 DIAGNOSIS — N898 Other specified noninflammatory disorders of vagina: Secondary | ICD-10-CM | POA: Diagnosis not present

## 2016-05-03 DIAGNOSIS — K1379 Other lesions of oral mucosa: Secondary | ICD-10-CM | POA: Diagnosis not present

## 2016-05-03 DIAGNOSIS — Z113 Encounter for screening for infections with a predominantly sexual mode of transmission: Secondary | ICD-10-CM | POA: Diagnosis not present

## 2016-05-03 DIAGNOSIS — G8929 Other chronic pain: Secondary | ICD-10-CM | POA: Diagnosis not present

## 2016-05-03 DIAGNOSIS — Z124 Encounter for screening for malignant neoplasm of cervix: Secondary | ICD-10-CM | POA: Diagnosis not present

## 2016-05-03 DIAGNOSIS — M542 Cervicalgia: Secondary | ICD-10-CM | POA: Diagnosis not present

## 2016-05-03 DIAGNOSIS — Z23 Encounter for immunization: Secondary | ICD-10-CM | POA: Diagnosis not present

## 2016-05-03 DIAGNOSIS — M546 Pain in thoracic spine: Secondary | ICD-10-CM | POA: Diagnosis not present

## 2016-05-03 DIAGNOSIS — M5441 Lumbago with sciatica, right side: Secondary | ICD-10-CM | POA: Diagnosis not present

## 2016-05-03 DIAGNOSIS — M5442 Lumbago with sciatica, left side: Secondary | ICD-10-CM | POA: Diagnosis not present

## 2016-05-04 ENCOUNTER — Ambulatory Visit (INDEPENDENT_AMBULATORY_CARE_PROVIDER_SITE_OTHER): Payer: Medicare Other | Admitting: Psychology

## 2016-05-04 DIAGNOSIS — R413 Other amnesia: Secondary | ICD-10-CM | POA: Diagnosis not present

## 2016-05-04 DIAGNOSIS — S069X9S Unspecified intracranial injury with loss of consciousness of unspecified duration, sequela: Secondary | ICD-10-CM
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
## 2016-05-05 ENCOUNTER — Encounter: Payer: Self-pay | Admitting: Psychology

## 2016-05-10 DIAGNOSIS — M4184 Other forms of scoliosis, thoracic region: Secondary | ICD-10-CM | POA: Diagnosis not present

## 2016-05-10 DIAGNOSIS — M5441 Lumbago with sciatica, right side: Secondary | ICD-10-CM | POA: Diagnosis not present

## 2016-05-10 DIAGNOSIS — M546 Pain in thoracic spine: Secondary | ICD-10-CM | POA: Diagnosis not present

## 2016-05-10 DIAGNOSIS — R079 Chest pain, unspecified: Secondary | ICD-10-CM | POA: Diagnosis not present

## 2016-05-10 DIAGNOSIS — G8929 Other chronic pain: Secondary | ICD-10-CM | POA: Diagnosis not present

## 2016-05-10 DIAGNOSIS — M5442 Lumbago with sciatica, left side: Secondary | ICD-10-CM | POA: Diagnosis not present

## 2016-05-10 DIAGNOSIS — J189 Pneumonia, unspecified organism: Secondary | ICD-10-CM | POA: Diagnosis not present

## 2016-05-10 DIAGNOSIS — M4126 Other idiopathic scoliosis, lumbar region: Secondary | ICD-10-CM | POA: Diagnosis not present

## 2016-05-16 ENCOUNTER — Ambulatory Visit (INDEPENDENT_AMBULATORY_CARE_PROVIDER_SITE_OTHER): Payer: Medicare Other | Admitting: Psychology

## 2016-05-16 ENCOUNTER — Encounter: Payer: Self-pay | Admitting: Psychology

## 2016-05-16 DIAGNOSIS — S069X9S Unspecified intracranial injury with loss of consciousness of unspecified duration, sequela: Secondary | ICD-10-CM

## 2016-05-16 DIAGNOSIS — R413 Other amnesia: Secondary | ICD-10-CM

## 2016-05-16 NOTE — Progress Notes (Signed)
   Neuropsychology Note  Kimberly Lynch returned today for 2 hours of neuropsychological testing with technician, Kimberly Lynch, Kimberly Lynch, under the supervision of Kimberly Lynch. The patient did not appear overtly distressed by the testing session, per behavioral observation or via self-report to the technician. Rest breaks were offered. Kimberly Lynch will return within 2 weeks for a feedback session with Kimberly Lynch at which time her test performances, clinical impressions and treatment recommendations will be reviewed in detail. The patient understands she can contact our office should she require our assistance before this time.  Full report to follow.

## 2016-05-22 NOTE — Progress Notes (Signed)
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 for verbal abuse or displays of physical violence.  She does appear to have some degree of control over more extreme displays of anger, such as damage to property and threats to assault others.  However, it is likely that those around her are intimidated by her temper and the potential for verbal abuse. According to the patient's self-report, she describes NO significant problems in the following areas: problems with empathy; unusually elevated mood or heightened activity.  With respect to suicidal ideation, the patient is NOT reporting distress from thoughts of self-harm.   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 Bipolar disorder, PTSD, Personality disorder (by history).   The patient likely has a history of congenital versus acquired intellectual disability. Intellectual abilities appear significantly below average. She also has limited education. However, she is presenting for more recent worsening of memory and cognitive functioning with bouts of confusion and neurologic symptoms which she feels are "mini strokes".  I suspect there was some attenuation of effort on the current examination; embedded validity indicators indicated the possibility of this. However, I also suspect  that she does have below-average cognitive functioning in most domains - it is impossible to know whether this represents longstanding functioning or a more recent decline in functioning but I suspect the former rather than the latter.   She is also reporting a significant level of  psychological distress; again, there may be some exaggeration in her endorsement of psychological symptoms as suggested by validity indicators on the PAI. She reports that she has been diagnosed with bipolar disorder, PTSD and a personality disorder. Her psychological testing certainly supports these diagnostic considerations.  Overall, based on the available data at this time, it is my clinical opinion that the patient's current complaints of neurologic/cogntiive symptoms are secondary to primary psychiatric disorder rather than a neurologic disorder. She may be experiencing a somatoform or somatization disorder.  Recommendations/Plan: Based on the findings of the present evaluation, the following recommendations are offered:  1. Continue follow-up with psychiatrist. The patient agreed it would be helpful for me to share these results with her psychiatrist, Dr. Lolly Mustache. Additionally, I spoke with her about dialectical behavioral therapy (DBT), as I think this type of treatment could be helpful for her. I will defer this decision to Dr. Lolly Mustache. 2. The patient will return to [facility] for [facility] care and follow-up.  3. Education regarding the impact of psychiatric disorders on cognitive functioning, and the fact that some psychiatric disorders cause stress to be manifested as neurologic/cognitive symptoms, was provided to the patient. She had some difficulty understanding this information but I did my best to explain it to her, and I provided written information as well.   Feedback to Patient: Kimberly Lynch returned for a feedback appointment on 05/23/2016 to review the results of her neuropsychological evaluation  with this provider. 30 minutes face-to-face time was spent reviewing her test results, my impressions and my recommendations as detailed above.    Total time spent on this patient's case: [id number] unit for interview with psychologist; [phone] units of testing by psychometrician under psychologist's supervision; [phone] units for medical record review, scoring of neuropsychological tests, interpretation of test results, preparation of this report, and review of results to the patient by psychologist.      Thank you for your referral of Kimberly Lynch. Please feel free to contact me if you have any questions or concerns regarding this report.

## 2016-05-23 ENCOUNTER — Ambulatory Visit (INDEPENDENT_AMBULATORY_CARE_PROVIDER_SITE_OTHER): Payer: Medicare Other | Admitting: Psychology

## 2016-05-23 ENCOUNTER — Encounter: Payer: Self-pay | Admitting: Psychology

## 2016-05-23 DIAGNOSIS — S069X9S Unspecified intracranial injury with loss of consciousness of unspecified duration, sequela: Secondary | ICD-10-CM

## 2016-05-23 DIAGNOSIS — R413 Other amnesia: Secondary | ICD-10-CM
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
## 2016-05-25 ENCOUNTER — Ambulatory Visit (INDEPENDENT_AMBULATORY_CARE_PROVIDER_SITE_OTHER): Payer: Medicare Other | Admitting: Psychiatry

## 2016-05-25 ENCOUNTER — Encounter (HOSPITAL_COMMUNITY): Payer: Self-pay | Admitting: Psychiatry

## 2016-05-25 ENCOUNTER — Telehealth (HOSPITAL_COMMUNITY): Payer: Self-pay | Admitting: Psychiatry

## 2016-05-25 DIAGNOSIS — F4310 Post-traumatic stress disorder, unspecified: Secondary | ICD-10-CM | POA: Diagnosis not present

## 2016-05-25 DIAGNOSIS — Z9889 Other specified postprocedural states: Secondary | ICD-10-CM | POA: Diagnosis not present

## 2016-05-25 DIAGNOSIS — F319 Bipolar disorder, unspecified: Secondary | ICD-10-CM

## 2016-05-25 DIAGNOSIS — F419 Anxiety disorder, unspecified: Secondary | ICD-10-CM

## 2016-05-25 DIAGNOSIS — Z9049 Acquired absence of other specified parts of digestive tract: Secondary | ICD-10-CM | POA: Diagnosis not present

## 2016-05-25 DIAGNOSIS — G4700 Insomnia, unspecified: Secondary | ICD-10-CM

## 2016-05-25 DIAGNOSIS — F17210 Nicotine dependence, cigarettes, uncomplicated: Secondary | ICD-10-CM

## 2016-05-25 DIAGNOSIS — F1290 Cannabis use, unspecified, uncomplicated: Secondary | ICD-10-CM | POA: Diagnosis not present

## 2016-05-25 DIAGNOSIS — Z79899 Other long term (current) drug therapy: Secondary | ICD-10-CM

## 2016-05-25 MED ORDER — DOXEPIN HCL 25 MG PO CAPS: 25.0000 mg | ORAL_CAPSULE | Freq: Every day | ORAL | 2 refills | Status: DC

## 2016-05-25 MED ORDER — FLUOXETINE HCL 40 MG PO CAPS: 40.0000 mg | ORAL_CAPSULE | Freq: Every day | ORAL | 2 refills | Status: DC

## 2016-05-25 MED ORDER — LITHIUM CARBONATE ER 450 MG PO TBCR: 450.0000 mg | EXTENDED_RELEASE_TABLET | Freq: Two times a day (BID) | ORAL | 2 refills | Status: DC

## 2016-05-25 MED ORDER — ALPRAZOLAM 0.5 MG PO TABS: 0.5000 mg | ORAL_TABLET | Freq: Two times a day (BID) | ORAL | 2 refills | Status: DC | PRN
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
## 2016-06-01 ENCOUNTER — Other Ambulatory Visit (HOSPITAL_COMMUNITY): Payer: Self-pay | Admitting: Psychiatry

## 2016-06-20 ENCOUNTER — Ambulatory Visit (INDEPENDENT_AMBULATORY_CARE_PROVIDER_SITE_OTHER): Payer: Medicare Other | Admitting: Orthopaedic Surgery

## 2016-06-20 ENCOUNTER — Encounter (INDEPENDENT_AMBULATORY_CARE_PROVIDER_SITE_OTHER): Payer: Self-pay | Admitting: Orthopaedic Surgery

## 2016-06-20 VITALS — BP 114/81 | HR 85 | Ht 64.0 in | Wt 139.0 lb

## 2016-06-20 DIAGNOSIS — M419 Scoliosis, unspecified: Secondary | ICD-10-CM | POA: Diagnosis not present

## 2016-06-20 NOTE — Progress Notes (Signed)
Office Visit Note   Patient: Kimberly Lynch           Date of Birth: [birth year]           MRN: [id number] Visit Date: 06/20/2016              Requested by: Tammy Eartha InchLamonica Boyd, MD [address] [place], [place] [id number] PCP: Verlon AuBoyd, Tammy Lamonica, MD   Assessment & Plan: Visit Diagnoses: Thoracic Scoliosis measured 47.  Plan: She would like to be referred to a tertiary center where they do scoliosis work. She requested [facility] in [place]. I discussed with her that did not do any scoliosis surgery. A mapping make the referral form will call her with the appointment time.  Follow-Up Instructions: No Follow-up on file.   Orders:  No orders of the defined types were placed in this encounter.  No orders of the defined types were placed in this encounter.     Procedures: No procedures performed   Clinical Data: No additional findings.   Subjective: Chief Complaint  Patient presents with  . Middle Back - Pain  . Lower Back - Pain    Patient presents with chronic back pain. She hurts all over her entire back, thoracic and lumbar regions. She states that she is ready for surgery. She woke up the other day and could hardly move. She has had plain film x-rays made that are available on [employer] PACS. By report, she has severe scoliosis. She takes ibuprofen as needed for her mouth pain, but this does not help with her back.   Patient has had 3 children does not have custody they stay with the children's grandparents. Was told she needed surgery of for scoliosis years ago but did not have the surgery. She is currently not working states her back causes pain. She is ambulatory. She states she has not worked for 10 years. She has scoliosis noted on [facility] reported at 47 right thoracic curve.  Review of Systems  Constitutional: Negative for chills and diaphoresis.  HENT: Negative for ear discharge, ear pain and  nosebleeds.   Eyes: Negative for discharge and visual disturbance.  Respiratory: Negative for cough, choking and shortness of breath.   Cardiovascular: Negative for chest pain and palpitations.  Gastrointestinal: Negative for abdominal distention and abdominal pain.  Endocrine: Negative for cold intolerance and heat intolerance.  Genitourinary: Negative for flank pain and hematuria.  Musculoskeletal:       Positive for thoracic scoliosis  Skin: Negative for rash and wound.  Neurological: Negative for seizures and speech difficulty.  Hematological: Negative for adenopathy. Does not bruise/bleed easily.  Psychiatric/Behavioral: Negative for agitation and suicidal ideas.       Positive for depression with bipolar disorder. He takes lithium, Xanax, doxepin and Prozac     Objective: Vital Signs: BP 114/81   Pulse 85   Ht 5\' 4"  (1.626 m)   Wt 139 lb (63 kg)   BMI 23.86 kg/m   Physical Exam  Constitutional: She is oriented to person, place, and time. She appears well-developed.  HENT:  Head: Normocephalic.  Right Ear: External ear normal.  Left Ear: External ear normal.  Eyes: Pupils are equal, round, and reactive to light.  Neck: No tracheal deviation present. No thyromegaly present.  Cardiovascular: Normal rate.   Pulmonary/Chest: Effort normal.  Abdominal: Soft.  Musculoskeletal:  Patient has the a right thoracic curve. She is rib asymmetry. Lower extremity  reflexes are normal she has heel toe gait. No lower extremity hyperreflexia. Lower extremity reflexes are 3+ and symmetrical.  Neurological: She is alert and oriented to person, place, and time.  Skin: Skin is warm and dry.  Psychiatric: She has a normal mood and affect. Her behavior is normal.    Ortho Exam  Specialty Comments:  No specialty comments available.  Imaging: No results found.   PMFS History: Patient Active Problem List   Diagnosis Date Noted  . Bipolar 1 disorder (HCC) 01/17/2016  . Biliary  dyskinesia   . Generalized abdominal cramping   . Cholelithiases 05/17/2015  . Acute cholecystitis 05/17/2015  . RUQ pain   . Headache 04/20/2015  . Near syncope 02/03/2015   Past Medical History:  Diagnosis Date  . Anxiety   . Arthritis   . Bipolar 1 disorder (HCC)   . Depression   . Headache 04/20/2015  . Near syncope 02/03/2015  . Panic   . Scoliosis     Family History  Problem Relation Age of Onset  . Adopted: Yes  . Cancer Other   . Diabetes Other     Past Surgical History:  Procedure Laterality Date  . CHOLECYSTECTOMY N/A 05/20/2015   Procedure: LAPAROSCOPIC CHOLECYSTECTOMY WITH ATTEMPTED INTRAOPERATIVE CHOLANGIOGRAM;  Surgeon: Violeta Gelinas, MD;  Location: [facility] OR;  Service: General;  Laterality: N/A;  . FRACTURE SURGERY    . TUBAL LIGATION     Social History   Occupational History  . [employer] Other   Social History Main Topics  . Smoking status: Current Every Day Smoker    Packs/day: 0.40    Years: 22.00    Types: Cigarettes  . Smokeless tobacco: Never Used     Comment: States has cut back and working to quit  . Alcohol use No     Comment: States has quit completely  . Drug use: Yes    Types: Marijuana     Comment: States used marijuana 05/24/16 helps wiht pain and aniety   . Sexual activity: Yes    Partners: Male    Birth control/ protection: None

## 2016-06-20 NOTE — Addendum Note (Signed)
Addended byCherre Huger: Sharlene Mccluskey on: 06/20/2016 01:48 PM   Modules accepted: Orders

## 2016-06-29 ENCOUNTER — Telehealth (INDEPENDENT_AMBULATORY_CARE_PROVIDER_SITE_OTHER): Payer: Self-pay | Admitting: Orthopaedic Surgery

## 2016-06-29 NOTE — Telephone Encounter (Signed)
Patient calling re the referral to [facility] or [facility].  She would like to know where you are on getting her in with a specialist.  Please advise.  Her pain level is 7 or 8 on a scale of 1-10.

## 2016-06-30 NOTE — Telephone Encounter (Signed)
Wanted to send you this message. I know that you and Timmothy Euler had discussed and that you had already sent the info to facility. Thank you for your help.

## 2016-07-03 NOTE — Telephone Encounter (Signed)
I called [facility] and sw Kimberly Lynch and computer is down and will call me back as soon as comes up and she checks the status of referral.

## 2016-07-03 NOTE — Telephone Encounter (Signed)
Judeth Cornfield called back and stated hasnt seen referral and to please refax, order was refaxed to [facility]. Pending appt

## 2016-07-19 DIAGNOSIS — M419 Scoliosis, unspecified: Secondary | ICD-10-CM | POA: Insufficient documentation

## 2016-07-19 DIAGNOSIS — M4180 Other forms of scoliosis, site unspecified: Secondary | ICD-10-CM | POA: Diagnosis not present

## 2016-07-19 DIAGNOSIS — M4184 Other forms of scoliosis, thoracic region: Secondary | ICD-10-CM | POA: Diagnosis not present

## 2016-07-24 DIAGNOSIS — G8929 Other chronic pain: Secondary | ICD-10-CM | POA: Diagnosis not present

## 2016-07-24 DIAGNOSIS — R05 Cough: Secondary | ICD-10-CM | POA: Diagnosis not present

## 2016-07-24 DIAGNOSIS — M79605 Pain in left leg: Secondary | ICD-10-CM | POA: Diagnosis not present

## 2016-07-24 DIAGNOSIS — M79604 Pain in right leg: Secondary | ICD-10-CM | POA: Diagnosis not present

## 2016-07-24 DIAGNOSIS — R202 Paresthesia of skin: Secondary | ICD-10-CM | POA: Diagnosis not present

## 2016-07-24 DIAGNOSIS — R635 Abnormal weight gain: Secondary | ICD-10-CM | POA: Diagnosis not present

## 2016-07-24 DIAGNOSIS — M546 Pain in thoracic spine: Secondary | ICD-10-CM | POA: Diagnosis not present

## 2016-07-24 DIAGNOSIS — J029 Acute pharyngitis, unspecified: Secondary | ICD-10-CM | POA: Diagnosis not present

## 2016-07-27 DIAGNOSIS — R202 Paresthesia of skin: Secondary | ICD-10-CM | POA: Diagnosis not present

## 2016-07-27 DIAGNOSIS — F17210 Nicotine dependence, cigarettes, uncomplicated: Secondary | ICD-10-CM | POA: Diagnosis not present

## 2016-07-27 DIAGNOSIS — M41124 Adolescent idiopathic scoliosis, thoracic region: Secondary | ICD-10-CM | POA: Diagnosis not present

## 2016-07-27 DIAGNOSIS — Z888 Allergy status to other drugs, medicaments and biological substances status: Secondary | ICD-10-CM | POA: Diagnosis not present

## 2016-08-28 ENCOUNTER — Ambulatory Visit: Payer: Medicare Other | Admitting: Adult Health

## 2016-08-28 ENCOUNTER — Other Ambulatory Visit (HOSPITAL_COMMUNITY): Payer: Self-pay | Admitting: Psychiatry

## 2016-08-28 DIAGNOSIS — G4700 Insomnia, unspecified: Secondary | ICD-10-CM

## 2016-08-29 ENCOUNTER — Ambulatory Visit (INDEPENDENT_AMBULATORY_CARE_PROVIDER_SITE_OTHER): Payer: Medicare Other | Admitting: Psychiatry

## 2016-08-29 ENCOUNTER — Encounter (HOSPITAL_COMMUNITY): Payer: Self-pay | Admitting: Psychiatry

## 2016-08-29 DIAGNOSIS — F609 Personality disorder, unspecified: Secondary | ICD-10-CM | POA: Diagnosis not present

## 2016-08-29 DIAGNOSIS — G4700 Insomnia, unspecified: Secondary | ICD-10-CM | POA: Diagnosis not present

## 2016-08-29 DIAGNOSIS — F17210 Nicotine dependence, cigarettes, uncomplicated: Secondary | ICD-10-CM | POA: Diagnosis not present

## 2016-08-29 DIAGNOSIS — F319 Bipolar disorder, unspecified: Secondary | ICD-10-CM

## 2016-08-29 MED ORDER — FLUOXETINE HCL 40 MG PO CAPS: 40.0000 mg | ORAL_CAPSULE | Freq: Every day | ORAL | 2 refills | Status: DC

## 2016-08-29 MED ORDER — DOXEPIN HCL 25 MG PO CAPS: 25.0000 mg | ORAL_CAPSULE | Freq: Every day | ORAL | 2 refills | Status: DC

## 2016-08-29 MED ORDER — LITHIUM CARBONATE ER 450 MG PO TBCR: 450.0000 mg | EXTENDED_RELEASE_TABLET | Freq: Two times a day (BID) | ORAL | 2 refills | Status: DC

## 2016-08-29 MED ORDER — ALPRAZOLAM 0.5 MG PO TABS: 0.5000 mg | ORAL_TABLET | Freq: Two times a day (BID) | ORAL | 2 refills | Status: DC | PRN
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
## 2016-11-29 ENCOUNTER — Ambulatory Visit (INDEPENDENT_AMBULATORY_CARE_PROVIDER_SITE_OTHER): Payer: Medicare Other | Admitting: Psychiatry

## 2016-11-29 ENCOUNTER — Encounter (HOSPITAL_COMMUNITY): Payer: Self-pay | Admitting: Psychiatry

## 2016-11-29 VITALS — BP 122/74 | HR 110 | Ht 64.0 in | Wt 185.0 lb

## 2016-11-29 DIAGNOSIS — F1290 Cannabis use, unspecified, uncomplicated: Secondary | ICD-10-CM | POA: Diagnosis not present

## 2016-11-29 DIAGNOSIS — M2550 Pain in unspecified joint: Secondary | ICD-10-CM

## 2016-11-29 DIAGNOSIS — R42 Dizziness and giddiness: Secondary | ICD-10-CM | POA: Diagnosis not present

## 2016-11-29 DIAGNOSIS — F609 Personality disorder, unspecified: Secondary | ICD-10-CM | POA: Diagnosis not present

## 2016-11-29 DIAGNOSIS — M549 Dorsalgia, unspecified: Secondary | ICD-10-CM | POA: Diagnosis not present

## 2016-11-29 DIAGNOSIS — F17210 Nicotine dependence, cigarettes, uncomplicated: Secondary | ICD-10-CM

## 2016-11-29 DIAGNOSIS — F4310 Post-traumatic stress disorder, unspecified: Secondary | ICD-10-CM

## 2016-11-29 DIAGNOSIS — G4700 Insomnia, unspecified: Secondary | ICD-10-CM | POA: Diagnosis not present

## 2016-11-29 DIAGNOSIS — F319 Bipolar disorder, unspecified: Secondary | ICD-10-CM

## 2016-11-29 MED ORDER — BUPROPION HCL 75 MG PO TABS: 75.0000 mg | ORAL_TABLET | Freq: Two times a day (BID) | ORAL | 1 refills | Status: DC

## 2016-11-29 MED ORDER — ALPRAZOLAM 0.5 MG PO TABS: 0.5000 mg | ORAL_TABLET | Freq: Two times a day (BID) | ORAL | 1 refills | Status: DC | PRN

## 2016-11-29 MED ORDER — DOXEPIN HCL 25 MG PO CAPS: 25.0000 mg | ORAL_CAPSULE | Freq: Every day | ORAL | 1 refills | Status: DC

## 2016-11-29 MED ORDER — LITHIUM CARBONATE ER 450 MG PO TBCR: 450.0000 mg | EXTENDED_RELEASE_TABLET | Freq: Every day | ORAL | 0 refills | Status: DC
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
## 2016-12-04 DIAGNOSIS — J410 Simple chronic bronchitis: Secondary | ICD-10-CM | POA: Diagnosis not present

## 2016-12-04 DIAGNOSIS — M4124 Other idiopathic scoliosis, thoracic region: Secondary | ICD-10-CM | POA: Diagnosis not present

## 2016-12-04 DIAGNOSIS — R062 Wheezing: Secondary | ICD-10-CM | POA: Diagnosis not present

## 2016-12-04 DIAGNOSIS — E669 Obesity, unspecified: Secondary | ICD-10-CM | POA: Diagnosis not present

## 2016-12-04 DIAGNOSIS — G8929 Other chronic pain: Secondary | ICD-10-CM | POA: Diagnosis not present

## 2016-12-04 DIAGNOSIS — N393 Stress incontinence (female) (male): Secondary | ICD-10-CM | POA: Diagnosis not present

## 2016-12-04 DIAGNOSIS — M546 Pain in thoracic spine: Secondary | ICD-10-CM | POA: Diagnosis not present

## 2016-12-04 DIAGNOSIS — F17210 Nicotine dependence, cigarettes, uncomplicated: Secondary | ICD-10-CM | POA: Diagnosis not present

## 2016-12-05 ENCOUNTER — Other Ambulatory Visit (HOSPITAL_COMMUNITY): Payer: Self-pay | Admitting: Psychiatry

## 2016-12-05 ENCOUNTER — Telehealth (HOSPITAL_COMMUNITY): Payer: Self-pay

## 2016-12-05 NOTE — Telephone Encounter (Signed)
I called patient and she said that she had already cut back to 1 a day, but that even on one a day she was angry. Today she said she was psychotic while taking it. She has not had any today and said she would prefer not to take it at all. Please review and advise, thank you

## 2016-12-05 NOTE — Telephone Encounter (Signed)
She can cut down her Wellbutrin to 1 tablet a day and continue Xanax for anxiety.

## 2016-12-05 NOTE — Telephone Encounter (Signed)
She should stop the Wellbutrin.

## 2016-12-05 NOTE — Telephone Encounter (Signed)
Patient is calling to say that the Wellbutrin is working on her depression, however it is making her really angry and it is doing nothing for her anxiety. Please review and advise, thank you

## 2016-12-05 NOTE — Telephone Encounter (Signed)
I called the patient and let her know to stop the Wellbutrin and continue the Xanax.

## 2016-12-27 DIAGNOSIS — M546 Pain in thoracic spine: Secondary | ICD-10-CM | POA: Diagnosis not present

## 2016-12-27 DIAGNOSIS — M4120 Other idiopathic scoliosis, site unspecified: Secondary | ICD-10-CM | POA: Diagnosis not present

## 2016-12-27 DIAGNOSIS — G8929 Other chronic pain: Secondary | ICD-10-CM | POA: Diagnosis not present

## 2016-12-27 DIAGNOSIS — M4125 Other idiopathic scoliosis, thoracolumbar region: Secondary | ICD-10-CM | POA: Diagnosis not present

## 2016-12-27 DIAGNOSIS — M5442 Lumbago with sciatica, left side: Secondary | ICD-10-CM | POA: Diagnosis not present

## 2016-12-27 DIAGNOSIS — M5441 Lumbago with sciatica, right side: Secondary | ICD-10-CM | POA: Diagnosis not present

## 2016-12-27 DIAGNOSIS — M4185 Other forms of scoliosis, thoracolumbar region: Secondary | ICD-10-CM | POA: Diagnosis not present

## 2016-12-27 DIAGNOSIS — M4124 Other idiopathic scoliosis, thoracic region: Secondary | ICD-10-CM | POA: Diagnosis not present

## 2017-01-09 DIAGNOSIS — M4184 Other forms of scoliosis, thoracic region: Secondary | ICD-10-CM | POA: Diagnosis not present

## 2017-01-09 DIAGNOSIS — M4186 Other forms of scoliosis, lumbar region: Secondary | ICD-10-CM | POA: Diagnosis not present

## 2017-01-09 DIAGNOSIS — M4182 Other forms of scoliosis, cervical region: Secondary | ICD-10-CM | POA: Diagnosis not present

## 2017-01-09 DIAGNOSIS — M4124 Other idiopathic scoliosis, thoracic region: Secondary | ICD-10-CM | POA: Diagnosis not present

## 2017-01-09 DIAGNOSIS — M4120 Other idiopathic scoliosis, site unspecified: Secondary | ICD-10-CM | POA: Diagnosis not present

## 2017-01-10 ENCOUNTER — Ambulatory Visit (INDEPENDENT_AMBULATORY_CARE_PROVIDER_SITE_OTHER): Payer: Medicare Other | Admitting: Psychiatry

## 2017-01-10 ENCOUNTER — Encounter (HOSPITAL_COMMUNITY): Payer: Self-pay | Admitting: Psychiatry

## 2017-01-10 VITALS — BP 122/72 | HR 88 | Ht 64.0 in | Wt 184.2 lb

## 2017-01-10 DIAGNOSIS — M542 Cervicalgia: Secondary | ICD-10-CM | POA: Diagnosis not present

## 2017-01-10 DIAGNOSIS — F319 Bipolar disorder, unspecified: Secondary | ICD-10-CM

## 2017-01-10 DIAGNOSIS — R5383 Other fatigue: Secondary | ICD-10-CM | POA: Diagnosis not present

## 2017-01-10 DIAGNOSIS — F17210 Nicotine dependence, cigarettes, uncomplicated: Secondary | ICD-10-CM

## 2017-01-10 DIAGNOSIS — F609 Personality disorder, unspecified: Secondary | ICD-10-CM

## 2017-01-10 DIAGNOSIS — R5381 Other malaise: Secondary | ICD-10-CM

## 2017-01-10 DIAGNOSIS — M2550 Pain in unspecified joint: Secondary | ICD-10-CM | POA: Diagnosis not present

## 2017-01-10 DIAGNOSIS — G4700 Insomnia, unspecified: Secondary | ICD-10-CM

## 2017-01-10 DIAGNOSIS — F1290 Cannabis use, unspecified, uncomplicated: Secondary | ICD-10-CM | POA: Diagnosis not present

## 2017-01-10 MED ORDER — LITHIUM CARBONATE ER 300 MG PO TBCR: 600.0000 mg | EXTENDED_RELEASE_TABLET | Freq: Every day | ORAL | 1 refills | Status: DC

## 2017-01-10 MED ORDER — ALPRAZOLAM 0.5 MG PO TABS: 0.5000 mg | ORAL_TABLET | Freq: Every day | ORAL | 1 refills | Status: DC

## 2017-01-10 MED ORDER — HALOPERIDOL 2 MG PO TABS: 2.0000 mg | ORAL_TABLET | Freq: Every day | ORAL | 1 refills | Status: DC

## 2017-01-10 MED ORDER — ZOLPIDEM TARTRATE 10 MG PO TABS: ORAL_TABLET | ORAL | 0 refills | Status: DC
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
## 2017-01-15 ENCOUNTER — Other Ambulatory Visit (HOSPITAL_COMMUNITY): Payer: Self-pay

## 2017-01-15 ENCOUNTER — Telehealth (HOSPITAL_COMMUNITY): Payer: Self-pay

## 2017-01-15 NOTE — Telephone Encounter (Signed)
Patient is calling, she said she took the Haldol one night, and was up all night. She was hallucinating and having chest pain. She would like something else to help her sleep.

## 2017-01-19 NOTE — Telephone Encounter (Signed)
I called patient and left her a voicemail letting her know that the doctor did not have anything else to offer to help her sleep. I left my number for patient to call me back at my desk number

## 2017-01-23 ENCOUNTER — Telehealth (HOSPITAL_COMMUNITY): Payer: Self-pay

## 2017-01-23 ENCOUNTER — Other Ambulatory Visit (HOSPITAL_COMMUNITY): Payer: Self-pay | Admitting: Psychiatry

## 2017-01-23 DIAGNOSIS — F319 Bipolar disorder, unspecified: Secondary | ICD-10-CM
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
## 2017-03-13 IMAGING — DX DG CHEST 2V
2 series · 2 of 2 positions shown · non-contrast
Comparison: 05/17/2015

CLINICAL DATA: Productive cough, fever, chest pain, and shortness
breath for 1 week.

EXAM:
CHEST  2 VIEW

[chest pa]
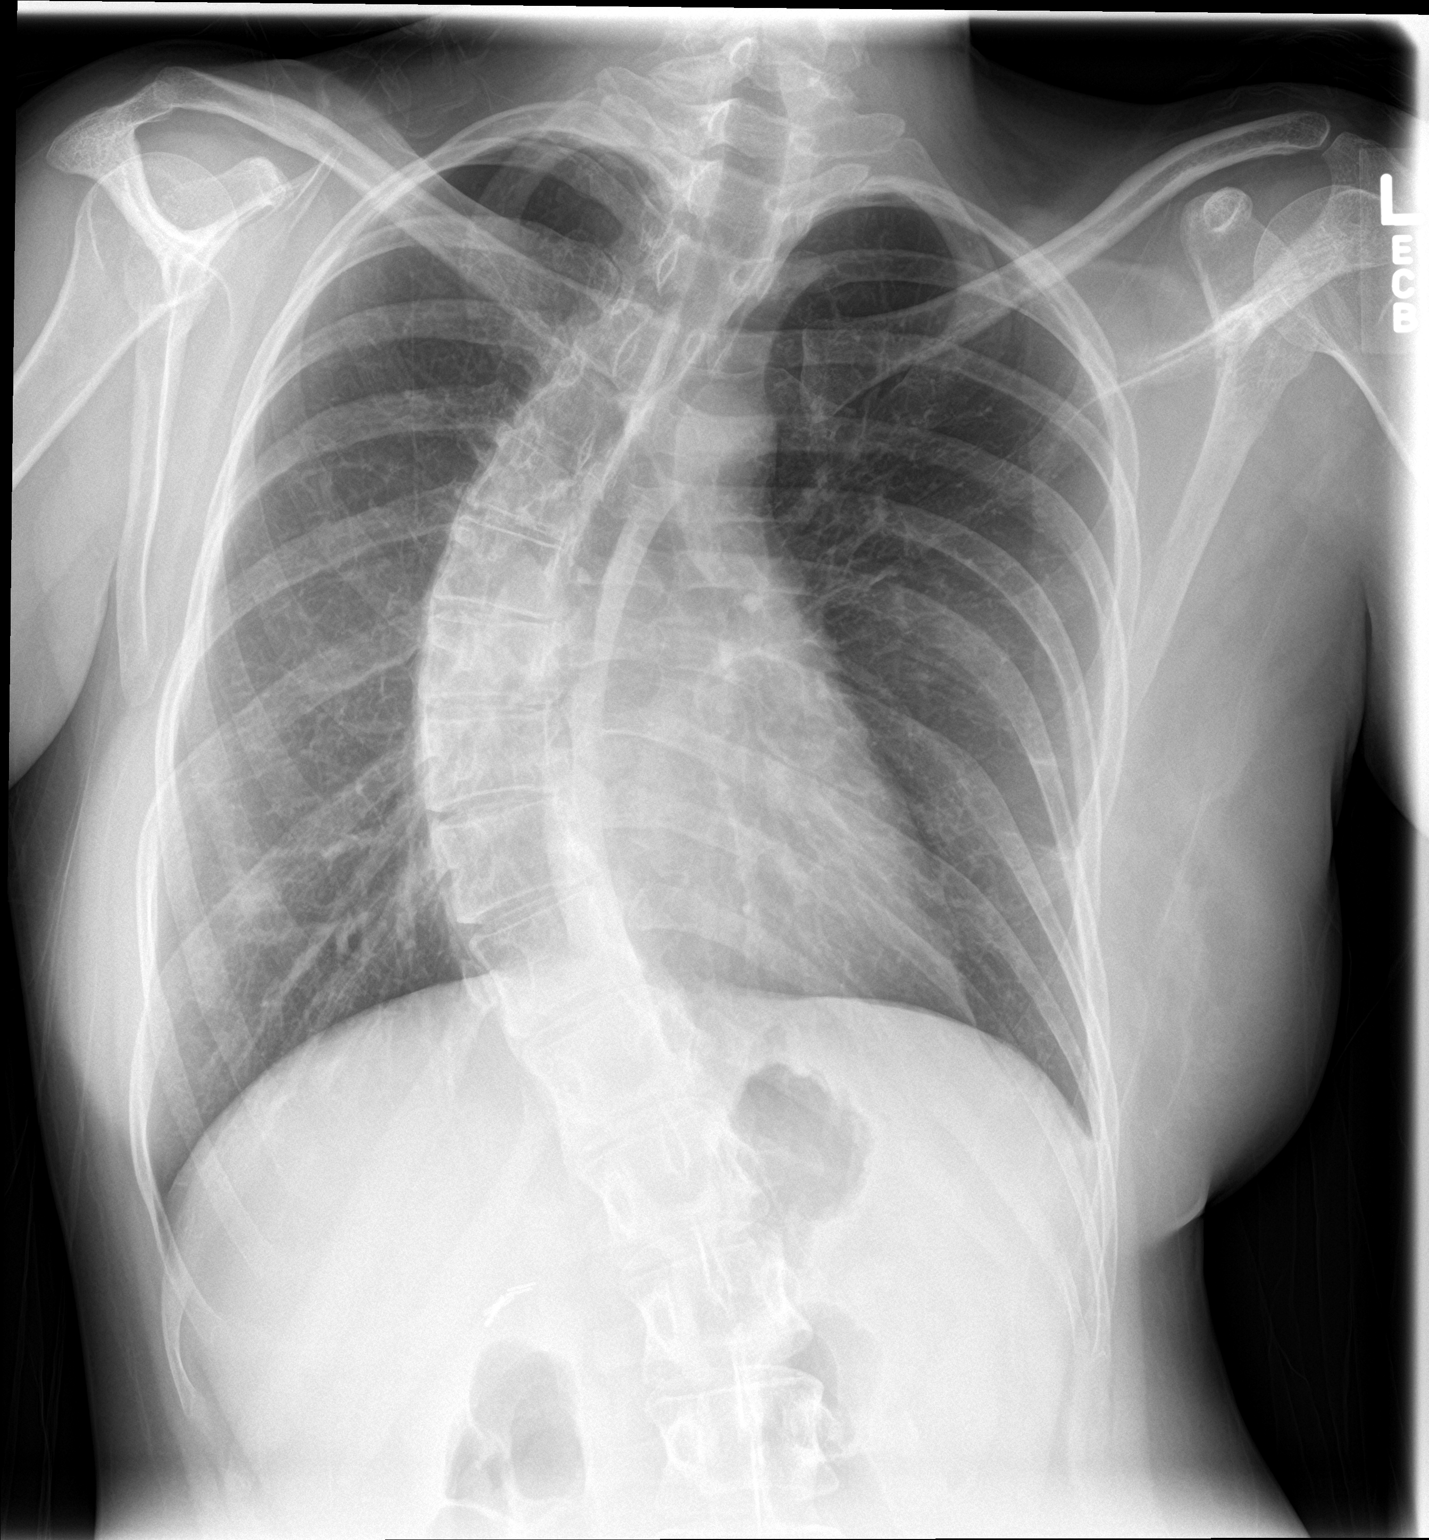

[chest lat]
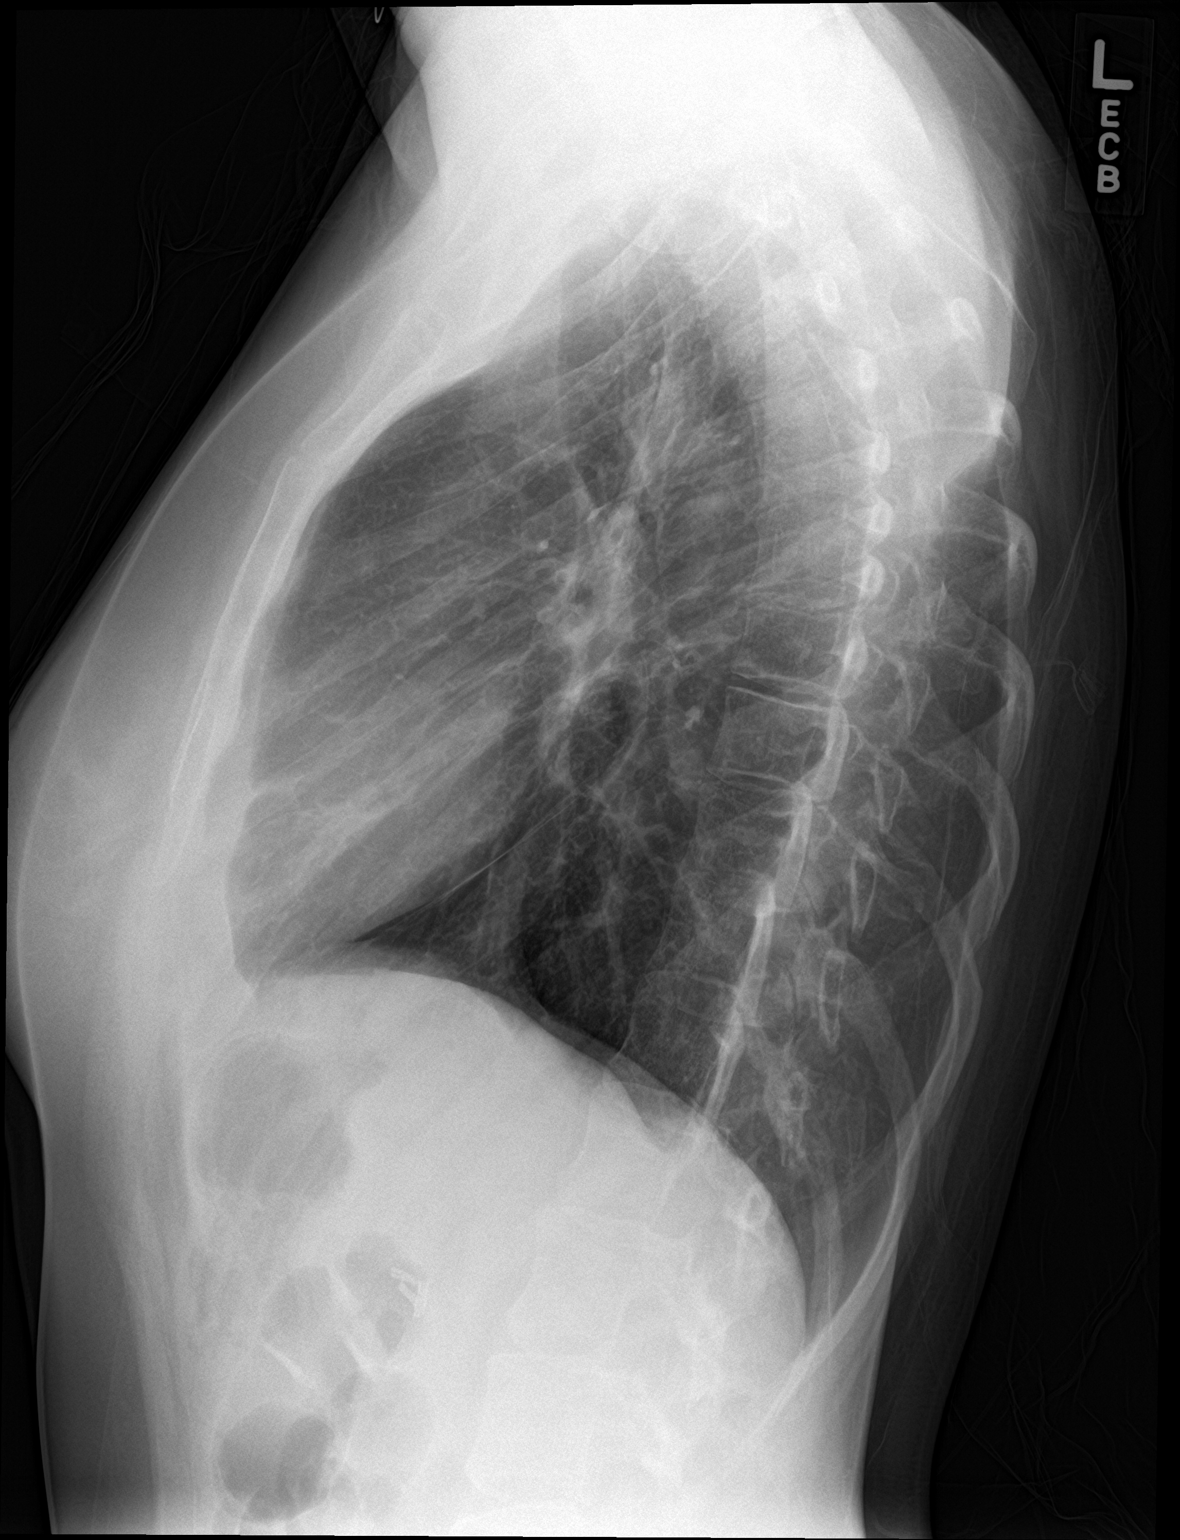

[2 of 2 positions shown; findings below may reference images not displayed]

FINDINGS: Patchy opacity is seen in the anterior lung base on the lateral
projection, likely within the right middle lobe. This is new since
previous study and suspicious for pneumonia.

No evidence of pleural effusion. Heart size and mediastinal contours
are within normal limits. Moderate thoracic dextroscoliosis again
noted.
IMPRESSION: New opacity best seen on lateral projection, likely in right middle
lobe, suspicious pneumonia. Recommend followup post-treatment PA and
lateral chest X-ray in several weeks to ensure resolution.

Thoracic dextroscoliosis.

## 2017-03-14 ENCOUNTER — Encounter (HOSPITAL_COMMUNITY): Payer: Self-pay | Admitting: Psychiatry

## 2017-03-14 ENCOUNTER — Ambulatory Visit (INDEPENDENT_AMBULATORY_CARE_PROVIDER_SITE_OTHER): Payer: Medicare Other | Admitting: Psychiatry

## 2017-03-14 DIAGNOSIS — F17210 Nicotine dependence, cigarettes, uncomplicated: Secondary | ICD-10-CM

## 2017-03-14 DIAGNOSIS — Z79899 Other long term (current) drug therapy: Secondary | ICD-10-CM | POA: Diagnosis not present

## 2017-03-14 DIAGNOSIS — F319 Bipolar disorder, unspecified: Secondary | ICD-10-CM | POA: Diagnosis not present

## 2017-03-14 DIAGNOSIS — F419 Anxiety disorder, unspecified: Secondary | ICD-10-CM | POA: Diagnosis not present

## 2017-03-14 DIAGNOSIS — F4310 Post-traumatic stress disorder, unspecified: Secondary | ICD-10-CM

## 2017-03-14 MED ORDER — ARIPIPRAZOLE 5 MG PO TABS: 5.0000 mg | ORAL_TABLET | Freq: Every day | ORAL | 1 refills | Status: DC

## 2017-03-14 MED ORDER — ALPRAZOLAM 0.5 MG PO TABS: 0.5000 mg | ORAL_TABLET | Freq: Every day | ORAL | 0 refills | Status: DC
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
## 2017-03-25 ENCOUNTER — Encounter (HOSPITAL_COMMUNITY): Payer: Self-pay | Admitting: Emergency Medicine

## 2017-03-25 ENCOUNTER — Emergency Department (HOSPITAL_COMMUNITY)
Admission: EM | Admit: 2017-03-25 | Discharge: 2017-03-26 | Disposition: A | Discharge status: Home / Self Care | Payer: Medicare Other | Attending: Emergency Medicine | Admitting: Emergency Medicine

## 2017-03-25 ENCOUNTER — Other Ambulatory Visit: Payer: Self-pay

## 2017-03-25 DIAGNOSIS — R112 Nausea with vomiting, unspecified: Secondary | ICD-10-CM | POA: Diagnosis not present

## 2017-03-25 DIAGNOSIS — R1084 Generalized abdominal pain: Secondary | ICD-10-CM

## 2017-03-25 DIAGNOSIS — G8929 Other chronic pain: Secondary | ICD-10-CM | POA: Diagnosis not present

## 2017-03-25 DIAGNOSIS — F17210 Nicotine dependence, cigarettes, uncomplicated: Secondary | ICD-10-CM | POA: Diagnosis not present

## 2017-03-25 DIAGNOSIS — R109 Unspecified abdominal pain: Secondary | ICD-10-CM | POA: Diagnosis not present

## 2017-03-25 DIAGNOSIS — M545 Low back pain: Secondary | ICD-10-CM | POA: Diagnosis not present

## 2017-03-25 DIAGNOSIS — R1011 Right upper quadrant pain: Secondary | ICD-10-CM | POA: Diagnosis not present

## 2017-03-25 LAB — COMPREHENSIVE METABOLIC PANEL
ALT: 28 U/L (ref 14–54)
ANION GAP: 9 (ref 5–15)
AST: 25 U/L (ref 15–41)
Albumin: 4.1 g/dL (ref 3.5–5.0)
Alkaline Phosphatase: 108 U/L (ref 38–126)
BILIRUBIN TOTAL: 0.2 mg/dL — AB (ref 0.3–1.2)
BUN: 5 mg/dL — ABNORMAL LOW (ref 6–20)
CO2: 24 mmol/L (ref 22–32)
Calcium: 9.1 mg/dL (ref 8.9–10.3)
Chloride: 106 mmol/L (ref 101–111)
Creatinine, Ser: 0.66 mg/dL (ref 0.44–1.00)
Glucose, Bld: 113 mg/dL — ABNORMAL HIGH (ref 65–99)
POTASSIUM: 3.2 mmol/L — AB (ref 3.5–5.1)
Sodium: 139 mmol/L (ref 135–145)
TOTAL PROTEIN: 7.6 g/dL (ref 6.5–8.1)

## 2017-03-25 LAB — CBC
HEMATOCRIT: 43.5 % (ref 36.0–46.0)
HEMOGLOBIN: 13.9 g/dL (ref 12.0–15.0)
MCH: 28 pg (ref 26.0–34.0)
MCHC: 32 g/dL (ref 30.0–36.0)
MCV: 87.7 fL (ref 78.0–100.0)
Platelets: 403 10*3/uL — ABNORMAL HIGH (ref 150–400)
RBC: 4.96 MIL/uL (ref 3.87–5.11)
RDW: 13.6 % (ref 11.5–15.5)
WBC: 9.9 10*3/uL (ref 4.0–10.5)

## 2017-03-25 LAB — LIPASE, BLOOD: Lipase: 21 U/L (ref 11–51)

## 2017-03-25 LAB — I-STAT BETA HCG BLOOD, ED (MC, WL, AP ONLY)

## 2017-03-25 NOTE — ED Triage Notes (Signed)
Pt c/o dizziness/lightheadedness x 1 week, RUQ pain x 3 days with rib pain. +nausea, emesis yesterday.  Pt also concerned about hot flashes she has been experiencing for several years.

## 2017-03-25 NOTE — ED Notes (Addendum)
Pt stated that she will "step outside for about 10 minutes", but will return.

## 2017-03-25 NOTE — ED Notes (Signed)
Pt returned from outside. Pt informed of wait time.

## 2017-03-26 ENCOUNTER — Emergency Department (HOSPITAL_COMMUNITY): Payer: Medicare Other

## 2017-03-26 DIAGNOSIS — R112 Nausea with vomiting, unspecified: Secondary | ICD-10-CM | POA: Diagnosis not present

## 2017-03-26 DIAGNOSIS — R1011 Right upper quadrant pain: Secondary | ICD-10-CM | POA: Diagnosis not present

## 2017-03-26 LAB — URINALYSIS, ROUTINE W REFLEX MICROSCOPIC
Bilirubin Urine: NEGATIVE
GLUCOSE, UA: NEGATIVE mg/dL
Hgb urine dipstick: NEGATIVE
KETONES UR: NEGATIVE mg/dL
Nitrite: NEGATIVE
PROTEIN: NEGATIVE mg/dL
Specific Gravity, Urine: 1.013 (ref 1.005–1.030)
pH: 6 (ref 5.0–8.0)

## 2017-03-26 MED ORDER — PROMETHAZINE HCL 25 MG PO TABS: 25.0000 mg | ORAL_TABLET | Freq: Four times a day (QID) | ORAL | 0 refills | Status: DC | PRN

## 2017-03-26 MED ORDER — MORPHINE SULFATE (PF) 4 MG/ML IV SOLN
4.0000 mg | Freq: Once | INTRAVENOUS | Status: AC
  Administered 2017-03-26: 4 mg via INTRAVENOUS
  Filled 2017-03-26: qty 1

## 2017-03-26 MED ORDER — IOPAMIDOL (ISOVUE-300) INJECTION 61%
INTRAVENOUS | Status: AC
  Administered 2017-03-26: 100 mL
  Filled 2017-03-26: qty 100

## 2017-03-26 MED ORDER — TRAMADOL HCL 50 MG PO TABS: 50.0000 mg | ORAL_TABLET | Freq: Four times a day (QID) | ORAL | 0 refills | Status: DC | PRN

## 2017-03-26 MED ORDER — ONDANSETRON HCL 4 MG/2ML IJ SOLN
4.0000 mg | Freq: Once | INTRAMUSCULAR | Status: AC
  Administered 2017-03-26: 4 mg via INTRAVENOUS
  Filled 2017-03-26: qty 2

## 2017-03-26 MED ORDER — SODIUM CHLORIDE 0.9 % IV BOLUS (SEPSIS)
1000.0000 mL | Freq: Once | INTRAVENOUS | Status: AC
  Administered 2017-03-26: 1000 mL via INTRAVENOUS

## 2017-03-26 NOTE — ED Notes (Signed)
PT states understanding of care given, follow up care, and medication prescribed. PT ambulated from ED to car with a steady gait. 

## 2017-03-26 NOTE — ED Provider Notes (Signed)
[facility]ENC[facility] Provider Note   CSN: [id number] Arrival date & time: 03/25/17  1936     History   Chief Complaint Chief Complaint  Patient presents with  . Abdominal Pain    HPI Kimberly Lynch is a 31 y.o. female.  HPI Patient presents to the emergency department with right upper abdominal discomfort with nausea and vomiting yesterday.  The patient states that nothing seems to make the condition better or worse.  Patient states she did not take any medications prior to arrival.  The patient denies chest pain, shortness of breath, headache,blurred vision, neck pain, fever, cough, weakness, numbness, dizziness, anorexia, edema, diarrhea, rash, back pain, dysuria, hematemesis, bloody stool, near syncope, or syncope. Past Medical History:  Diagnosis Date  . Anxiety   . Arthritis   . Bipolar 1 disorder (HCC)   . Depression   . Headache 04/20/2015  . Near syncope 02/03/2015  . Panic   . Scoliosis     Patient Active Problem List   Diagnosis Date Noted  . Bipolar 1 disorder (HCC) 01/17/2016  . Biliary dyskinesia   . Generalized abdominal cramping   . Cholelithiases 05/17/2015  . Acute cholecystitis 05/17/2015  . RUQ pain   . Headache 04/20/2015  . Near syncope 02/03/2015    Past Surgical History:  Procedure Laterality Date  . CHOLECYSTECTOMY N/A 05/20/2015   Procedure: LAPAROSCOPIC CHOLECYSTECTOMY WITH ATTEMPTED INTRAOPERATIVE CHOLANGIOGRAM;  Surgeon: Violeta GelinasBurke Thompson, MD;  Location: [facility] OR;  Service: General;  Laterality: N/A;  . FRACTURE SURGERY    . TUBAL LIGATION      OB History    No data available       Home Medications    Prior to Admission medications   Medication Sig Start Date End Date Taking? Authorizing Provider  albuterol (PROVENTIL HFA;VENTOLIN HFA) 108 (90 Base) MCG/ACT inhaler Inhale 1-2 puffs into the lungs every 6 (six) hours as needed for wheezing. 04/18/16  Yes Rolland PorterJames, Mark, MD  ALPRAZolam Prudy Feeler(XANAX) 0.5 MG tablet  Take 1 tablet (0.5 mg total) by mouth daily. Patient taking differently: Take 0.5 mg by mouth at bedtime.  03/14/17 03/14/18 Yes Arfeen, Phillips GroutSyed T, MD  ARIPiprazole (ABILIFY) 5 MG tablet Take 1 tablet (5 mg total) by mouth daily. 03/14/17 03/14/18 Yes Arfeen, Phillips GroutSyed T, MD  promethazine (PHENERGAN) 25 MG tablet Take 1 tablet (25 mg total) by mouth every 6 (six) hours as needed for nausea or vomiting. 03/26/17   Sophonie Goforth, Cristal Deerhristopher, PA-C  traMADol (ULTRAM) 50 MG tablet Take 1 tablet (50 mg total) by mouth every 6 (six) hours as needed for severe pain. 03/26/17   Charlestine NightLawyer, Dwaine Pringle, PA-C    Family History Family History  Adopted: Yes  Problem Relation Age of Onset  . Cancer Other   . Diabetes Other     Social History Social History   Tobacco Use  . Smoking status: Current Every Day Smoker    Packs/day: 0.40    Years: 22.00    Pack years: 8.80    Types: Cigarettes  . Smokeless tobacco: Never Used  . Tobacco comment: States has cut back and working to quit  Substance Use Topics  . Alcohol use: No    Alcohol/week: 0.0 oz    Comment: States has quit completely  . Drug use: Yes    Types: Marijuana    Comment: States used marijuana      Allergies   Bupropion; Other; Vistaril [hydroxyzine]; and Chlorhexidine   Review of Systems Review of Systems  All other systems negative except as documented in the HPI. All pertinent positives and negatives as reviewed in the HPI.  Physical Exam Updated Vital Signs BP 106/76   Pulse 98   Temp 98.9 F (37.2 C) (Oral)   Resp 16   Ht 5\' 4"  (1.626 m)   Wt 76.2 kg (168 lb)   LMP 03/19/2017   SpO2 100%   BMI 28.84 kg/m   Physical Exam  Constitutional: She is oriented to person, place, and time. She appears well-developed and well-nourished. No distress.  HENT:  Head: Normocephalic and atraumatic.  Mouth/Throat: Oropharynx is clear and moist.  Eyes: Pupils are equal, round, and reactive to light.  Neck: Normal range of motion. Neck supple.   Cardiovascular: Normal rate, regular rhythm and normal heart sounds. Exam reveals no gallop and no friction rub.  No murmur heard. Pulmonary/Chest: Effort normal and breath sounds normal. No respiratory distress. She has no wheezes.  Abdominal: Soft. Bowel sounds are normal. She exhibits no distension. There is generalized tenderness.  Neurological: She is alert and oriented to person, place, and time. She exhibits normal muscle tone. Coordination normal.  Skin: Skin is warm and dry. Capillary refill takes less than 2 seconds. No rash noted. No erythema.  Psychiatric: She has a normal mood and affect. Her behavior is normal.  Nursing note and vitals reviewed.    ED Treatments / Results  Labs (all labs ordered are listed, but only abnormal results are displayed) Labs Reviewed  COMPREHENSIVE METABOLIC PANEL - Abnormal; Notable for the following components:      Result Value   Potassium 3.2 (*)    Glucose, Bld 113 (*)    BUN 5 (*)    Total Bilirubin 0.2 (*)    All other components within normal limits  CBC - Abnormal; Notable for the following components:   Platelets 403 (*)    All other components within normal limits  URINALYSIS, ROUTINE W REFLEX MICROSCOPIC - Abnormal; Notable for the following components:   APPearance HAZY (*)    Leukocytes, UA TRACE (*)    Bacteria, UA RARE (*)    Squamous Epithelial / LPF 6-30 (*)    All other components within normal limits  LIPASE, BLOOD  I-STAT BETA HCG BLOOD, ED ([facility], [facility], [facility] ONLY)    EKG  EKG Interpretation None       Radiology Ct Abdomen Pelvis W Contrast  Result Date: 03/26/2017 CLINICAL DATA:  Acute onset of right upper quadrant abdominal pain, with rib pain, nausea and vomiting. EXAM: CT ABDOMEN AND PELVIS WITH CONTRAST TECHNIQUE: Multidetector CT imaging of the abdomen and pelvis was performed using the standard protocol following bolus administration of intravenous contrast. CONTRAST:  ISOVUE-300 IOPAMIDOL  (ISOVUE-300) INJECTION 61% COMPARISON:  MRI of the lumbar spine performed 01/09/2017, and right upper quadrant ultrasound performed 05/17/2015 FINDINGS: Lower chest: The visualized lung bases are grossly clear. The visualized portions of the mediastinum are unremarkable. Hepatobiliary: The liver is unremarkable in appearance. The patient is status post cholecystectomy, with clips noted at the gallbladder fossa. The common bile duct remains normal in caliber. Pancreas: The pancreas is within normal limits. Spleen: The spleen is unremarkable in appearance. Adrenals/Urinary Tract: The adrenal glands are unremarkable in appearance. The kidneys are within normal limits. There is no evidence of hydronephrosis. No renal or ureteral stones are identified. No perinephric stranding is seen. Stomach/Bowel: The stomach is unremarkable in appearance. The small bowel is within normal limits. The appendix is normal in caliber,  without evidence of appendicitis. The colon is unremarkable in appearance. Vascular/Lymphatic: The abdominal aorta is unremarkable in appearance. The inferior vena cava is grossly unremarkable. No retroperitoneal lymphadenopathy is seen. No pelvic sidewall lymphadenopathy is identified. Reproductive: The bladder is mildly distended and grossly unremarkable. The uterus is grossly unremarkable in appearance. The ovaries are relatively symmetric. No suspicious adnexal masses are seen. Other: No additional soft tissue abnormalities are seen. Musculoskeletal: No acute osseous abnormalities are identified. The visualized musculature is unremarkable in appearance. IMPRESSION: No acute abnormality seen within the abdomen or pelvis. Electronically Signed   By: Roanna RaiderJeffery  Chang M.D.   On: 03/26/2017 02:44    Procedures Procedures (including critical care time)  Medications Ordered in ED Medications  sodium chloride 0.9 % bolus 1,000 mL (0 mLs Intravenous Stopped 03/26/17 0405)  morphine 4 MG/ML injection 4 mg  (4 mg Intravenous Given 03/26/17 0151)  ondansetron (ZOFRAN) injection 4 mg (4 mg Intravenous Given 03/26/17 0151)  iopamidol (ISOVUE-300) 61 % injection (100 mLs  Contrast Given 03/26/17 0212)     Initial Impression / Assessment and Plan / ED Course  I have reviewed the triage vital signs and the nursing notes.  Pertinent labs & imaging results that were available during my care of the patient were reviewed by me and considered in my medical decision making (see chart for details).     Be treated for gastroenteritis told return here as needed patient is been stable at this time.  She was given IV fluids and is feeling better.  She is tolerated oral fluids.  Told return here for any worsening in her condition.  Final Clinical Impressions(s) / ED Diagnoses   Final diagnoses:  Generalized abdominal pain  Chronic bilateral low back pain without sciatica    ED Discharge Orders        Ordered    traMADol (ULTRAM) 50 MG tablet  Every 6 hours PRN     03/26/17 0409    promethazine (PHENERGAN) 25 MG tablet  Every 6 hours PRN     03/26/17 0409       Charlestine NightLawyer, Ariel Dimitri, PA-C 03/28/17 0712    Ward, Layla MawKristen N, DO 03/28/17 [id number]

## 2017-03-26 NOTE — Discharge Instructions (Signed)
Return here as needed for any worsening in your condition.  Follow-up with your primary doctor. 

## 2017-03-26 NOTE — ED Notes (Signed)
Attempted IV x 1 unsuccessful.

## 2017-04-27 ENCOUNTER — Other Ambulatory Visit (HOSPITAL_COMMUNITY): Payer: Self-pay | Admitting: Psychiatry

## 2017-04-27 ENCOUNTER — Telehealth (HOSPITAL_COMMUNITY): Payer: Self-pay

## 2017-04-27 DIAGNOSIS — F319 Bipolar disorder, unspecified: Secondary | ICD-10-CM

## 2017-04-27 MED ORDER — ALPRAZOLAM 0.5 MG PO TABS: 0.5000 mg | ORAL_TABLET | Freq: Every day | ORAL | 0 refills | Status: DC

## 2017-04-27 NOTE — Telephone Encounter (Signed)
Patent is calling for a refill on her Xanax and she is asking for a increase to twice a day. Patient states anxiety is worse right now. Please review and advise, thank you

## 2017-05-15 ENCOUNTER — Ambulatory Visit (INDEPENDENT_AMBULATORY_CARE_PROVIDER_SITE_OTHER): Payer: Medicare PPO | Admitting: Psychiatry

## 2017-05-15 ENCOUNTER — Ambulatory Visit (INDEPENDENT_AMBULATORY_CARE_PROVIDER_SITE_OTHER): Payer: Medicare PPO

## 2017-05-15 ENCOUNTER — Encounter (HOSPITAL_COMMUNITY): Payer: Self-pay | Admitting: Psychiatry

## 2017-05-15 VITALS — BP 117/81 | HR 109 | Ht 64.0 in | Wt 173.0 lb

## 2017-05-15 DIAGNOSIS — F319 Bipolar disorder, unspecified: Secondary | ICD-10-CM

## 2017-05-15 DIAGNOSIS — F411 Generalized anxiety disorder: Secondary | ICD-10-CM

## 2017-05-15 DIAGNOSIS — Z79899 Other long term (current) drug therapy: Secondary | ICD-10-CM | POA: Diagnosis not present

## 2017-05-15 DIAGNOSIS — F4310 Post-traumatic stress disorder, unspecified: Secondary | ICD-10-CM | POA: Diagnosis not present

## 2017-05-15 DIAGNOSIS — F410 Panic disorder [episodic paroxysmal anxiety] without agoraphobia: Secondary | ICD-10-CM | POA: Diagnosis not present

## 2017-05-15 DIAGNOSIS — F17210 Nicotine dependence, cigarettes, uncomplicated: Secondary | ICD-10-CM

## 2017-05-15 MED ORDER — ARIPIPRAZOLE ER 400 MG IM PRSY
400.0000 mg | PREFILLED_SYRINGE | INTRAMUSCULAR | Status: DC
  Administered 2017-05-15: 400 mg via INTRAMUSCULAR

## 2017-05-15 MED ORDER — ARIPIPRAZOLE 10 MG PO TABS: 10.0000 mg | ORAL_TABLET | Freq: Every day | ORAL | 1 refills | Status: DC

## 2017-05-15 MED ORDER — CLONAZEPAM 0.5 MG PO TABS: 0.5000 mg | ORAL_TABLET | Freq: Two times a day (BID) | ORAL | 1 refills | Status: DC | PRN
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
## 2017-05-15 NOTE — Progress Notes (Signed)
Patient presents today with appropriate affect for a new start of Abilify Maintena 400 mg injection. Patient saw Dr. Lolly MustacheArfeen this afternoon and he ordered her to start today. Patient is not having any SI/HI or AH/VH, she is doing well and looking forward to the injection. A sample was provided, Abilify Maintena 400 mg was prepared as ordered and injected IM into patients left upper outer gluteal quadrant. Patient tolerated well and we made an appointment for 29 days from today. Patient will call if she has any side effects or questions

## 2017-05-20 DIAGNOSIS — M546 Pain in thoracic spine: Secondary | ICD-10-CM | POA: Diagnosis not present

## 2017-05-20 DIAGNOSIS — M419 Scoliosis, unspecified: Secondary | ICD-10-CM | POA: Diagnosis not present

## 2017-05-24 ENCOUNTER — Telehealth (HOSPITAL_COMMUNITY): Payer: Self-pay

## 2017-05-24 ENCOUNTER — Other Ambulatory Visit (HOSPITAL_COMMUNITY): Payer: Self-pay | Admitting: Psychiatry

## 2017-05-24 NOTE — Telephone Encounter (Signed)
Patient is calling wanting something for daytime depression. She states that Prozac worked in the past and she wants to know if she can get it again. Please review and advise, thank you

## 2017-05-24 NOTE — Telephone Encounter (Signed)
She should wait since we recently increased Abilify dose.  If symptoms do not improve in 2-3 weeks we will consider adding Prozac.

## 2017-05-26 NOTE — Telephone Encounter (Signed)
Called patient and lvm that we were not going to do Prozac yet. I told her to call me back in 2-3 weeks if she was still having symptoms

## 2017-05-27 DIAGNOSIS — R0602 Shortness of breath: Secondary | ICD-10-CM | POA: Diagnosis not present

## 2017-05-27 DIAGNOSIS — F419 Anxiety disorder, unspecified: Secondary | ICD-10-CM | POA: Diagnosis not present

## 2017-05-28 ENCOUNTER — Telehealth (HOSPITAL_COMMUNITY): Payer: Self-pay

## 2017-05-28 NOTE — Telephone Encounter (Signed)
Patient is calling, she states that she is having side effects from Abilify. She said she is shaky, restless, has insomnia and some chest pain. Please review and advise, thank you

## 2017-05-30 ENCOUNTER — Other Ambulatory Visit (HOSPITAL_COMMUNITY): Payer: Self-pay | Admitting: Psychiatry

## 2017-05-30 ENCOUNTER — Telehealth (HOSPITAL_COMMUNITY): Payer: Self-pay

## 2017-05-30 MED ORDER — BENZTROPINE MESYLATE 1 MG PO TABS: 0.5000 mg | ORAL_TABLET | Freq: Every day | ORAL | 2 refills | Status: DC

## 2017-05-30 NOTE — Telephone Encounter (Signed)
Patient called me back and she is presenting with symptoms of Akathisia. Patient is picking up the cogentin to help with tremors, but she said she wants to know of another way to get rid of the Akathisia. Please review and advise, thank you

## 2017-05-30 NOTE — Telephone Encounter (Signed)
Patient is agreeable to taking the cogentin, she is still very restless and states if this continues she is not going to take the injection again.

## 2017-05-30 NOTE — Telephone Encounter (Signed)
She can try Cogentin 0.5 mg at bedtime to help her shakes and tremors.  She will need to stop the Abilify oral and continue Abilify Maintena.

## 2017-05-31 ENCOUNTER — Other Ambulatory Visit (HOSPITAL_COMMUNITY): Payer: Self-pay | Admitting: Psychiatry

## 2017-05-31 NOTE — Telephone Encounter (Signed)
If Cogentin did not help her then we may need to stop the Abilify.

## 2017-06-05 NOTE — Telephone Encounter (Signed)
Patient said the Cogentin is working and she is wondering if she can increase to 1 tablet instead of half. Please review and advise, thank you

## 2017-06-11 ENCOUNTER — Ambulatory Visit (HOSPITAL_COMMUNITY): Payer: Medicare Other

## 2017-06-18 ENCOUNTER — Telehealth (HOSPITAL_COMMUNITY): Payer: Self-pay

## 2017-06-18 NOTE — Telephone Encounter (Signed)
Patient is calling because she says that she is still having restlessness and anxiety, she says that she can not sit still and wants to know how much longer this will last.

## 2017-06-19 NOTE — Telephone Encounter (Signed)
I called patient and she is no longer taking the oral Abilify and she said she is not coming back for the injection. She is already taking Cogentin at bedtime.

## 2017-06-19 NOTE — Telephone Encounter (Signed)
She is getting Abilify injection every month and she can stop the Abilify oral tablet.  She can also try Cogentin 1 mg at bedtime.

## 2017-06-20 ENCOUNTER — Other Ambulatory Visit (HOSPITAL_COMMUNITY): Payer: Self-pay | Admitting: Psychiatry

## 2017-06-20 NOTE — Telephone Encounter (Signed)
If she does not want to take Abilify can she go back on lithium.  If she do want then we can restart lithium 300 mg twice a day.

## 2017-06-22 NOTE — Telephone Encounter (Signed)
Called patient and left a voicemail asking her to call me back.

## 2017-06-22 NOTE — Telephone Encounter (Signed)
Patient called me back and said that she is willing to start Lithium, but wants to wait for a little bit because she is still restless and shaky from the Abilify. She is currently taking Clonazepam 0.5 mg bid and she is wanting to know if you can increase to 1 mg, she said the current dose is not helping anxiety. Please review and advise, thank you

## 2017-06-27 ENCOUNTER — Telehealth (HOSPITAL_COMMUNITY): Payer: Self-pay

## 2017-06-27 NOTE — Telephone Encounter (Signed)
Patient is calling because she said the anxiety is really bad and the Klonopin is not working. She would like to go back on Prozac and Xanax. Patient has a follow up on 4/22 and is on the wait list for cancellations. Please review and advise, thank you

## 2017-06-28 ENCOUNTER — Other Ambulatory Visit (HOSPITAL_COMMUNITY): Payer: Self-pay | Admitting: Psychiatry

## 2017-06-28 NOTE — Telephone Encounter (Signed)
I called patient and she does not want to take the Lithium due to it interacting with too many other things. She reiterated that Prozac works for her.

## 2017-06-28 NOTE — Telephone Encounter (Signed)
Okay start Prozac 10 mg daily for 1 week and then 20 mg daily.

## 2017-06-28 NOTE — Telephone Encounter (Signed)
I returned phone call and left a message.  We can discontinue Klonopin and restart Xanax 0.5 mg 1 tablet daily as needed for anxiety and panic attacks.  She should also go back to lithium which helps her in the past.   we will discuss starting Prozac on her next appointment.  If she agreed she can go back to lithium 300 mg twice a day.

## 2017-07-03 ENCOUNTER — Telehealth (HOSPITAL_COMMUNITY): Payer: Self-pay

## 2017-07-03 NOTE — Telephone Encounter (Signed)
No change until she is seen by physician.  She can come to intensive outpatient program.

## 2017-07-03 NOTE — Telephone Encounter (Signed)
Spoke with patient she said that transportation maybe a problem. Will call for IOP if things change

## 2017-07-03 NOTE — Telephone Encounter (Signed)
Patient called and said that her medication Klonopin is not working for her. She also wanted to know if she could be put back into IOP? Please advise

## 2017-07-04 MED ORDER — FLUOXETINE HCL 10 MG PO CAPS: ORAL_CAPSULE | ORAL | 0 refills | Status: DC

## 2017-07-04 NOTE — Telephone Encounter (Signed)
Sent in the medication and called patient to let her know.

## 2017-07-07 ENCOUNTER — Encounter (HOSPITAL_COMMUNITY): Payer: Self-pay | Admitting: Emergency Medicine

## 2017-07-07 ENCOUNTER — Emergency Department (HOSPITAL_COMMUNITY)
Admission: EM | Admit: 2017-07-07 | Discharge: 2017-07-07 | Disposition: A | Discharge status: Home / Self Care | Payer: Medicare PPO | Attending: Emergency Medicine | Admitting: Emergency Medicine

## 2017-07-07 ENCOUNTER — Other Ambulatory Visit: Payer: Self-pay

## 2017-07-07 ENCOUNTER — Emergency Department (HOSPITAL_COMMUNITY): Payer: Medicare PPO

## 2017-07-07 DIAGNOSIS — Z79899 Other long term (current) drug therapy: Secondary | ICD-10-CM | POA: Diagnosis not present

## 2017-07-07 DIAGNOSIS — F17210 Nicotine dependence, cigarettes, uncomplicated: Secondary | ICD-10-CM | POA: Insufficient documentation

## 2017-07-07 DIAGNOSIS — R000 Tachycardia, unspecified: Secondary | ICD-10-CM | POA: Diagnosis not present

## 2017-07-07 DIAGNOSIS — M549 Dorsalgia, unspecified: Secondary | ICD-10-CM | POA: Diagnosis present

## 2017-07-07 DIAGNOSIS — R52 Pain, unspecified: Secondary | ICD-10-CM

## 2017-07-07 DIAGNOSIS — M546 Pain in thoracic spine: Secondary | ICD-10-CM | POA: Insufficient documentation

## 2017-07-07 LAB — CBC
HEMATOCRIT: 44.5 % (ref 36.0–46.0)
Hemoglobin: 14.2 g/dL (ref 12.0–15.0)
MCH: 28 pg (ref 26.0–34.0)
MCHC: 31.9 g/dL (ref 30.0–36.0)
MCV: 87.8 fL (ref 78.0–100.0)
PLATELETS: 353 10*3/uL (ref 150–400)
RBC: 5.07 MIL/uL (ref 3.87–5.11)
RDW: 14.6 % (ref 11.5–15.5)
WBC: 10 10*3/uL (ref 4.0–10.5)

## 2017-07-07 LAB — BASIC METABOLIC PANEL
Anion gap: 10 (ref 5–15)
BUN: 6 mg/dL (ref 6–20)
CALCIUM: 9.4 mg/dL (ref 8.9–10.3)
CO2: 21 mmol/L — ABNORMAL LOW (ref 22–32)
CREATININE: 0.59 mg/dL (ref 0.44–1.00)
Chloride: 108 mmol/L (ref 101–111)
GFR calc Af Amer: >60 mL/min (ref >60)
GLUCOSE: 96 mg/dL (ref 65–99)
POTASSIUM: 3.7 mmol/L (ref 3.5–5.1)
Sodium: 139 mmol/L (ref 135–145)

## 2017-07-07 LAB — I-STAT BETA HCG BLOOD, ED (MC, WL, AP ONLY)

## 2017-07-07 LAB — I-STAT TROPONIN, ED: TROPONIN I, POC: 0 ng/mL (ref 0.00–0.08)

## 2017-07-07 MED ORDER — DICLOFENAC SODIUM 1 % TD GEL: 2.0000 g | Freq: Four times a day (QID) | TRANSDERMAL | 0 refills | Status: DC

## 2017-07-07 MED ORDER — MELOXICAM 7.5 MG PO TABS: 7.5000 mg | ORAL_TABLET | Freq: Every day | ORAL | 0 refills | Status: DC

## 2017-07-07 NOTE — ED Triage Notes (Signed)
[facility] for right sided rib pain that radiates to the back and chest pain. Pt has hx "double scoliosis", denies fall, back/rin pain x 3 weeks. Pt states her chest pain has been "off and on" x "years", spoke to a nurse today who advised her to come to ED.

## 2017-07-07 NOTE — ED Notes (Signed)
ED Provider at bedside. 

## 2017-07-07 NOTE — ED Provider Notes (Signed)
[facility] EMERGENCY DEPARTMENT Provider Note   CSN: [id number] Arrival date & time: 07/07/17  1549     History   Chief Complaint Chief Complaint  Patient presents with  . Chest Pain    HPI Kimberly Lynch is a 32 y.o. female presenting for evaluation of right sided back pain.  Patient states she has been having 4 weeks of right-sided back pain.  She states it is constant, worse with all movements.  Nothing makes it better besides heating pads.  She is tried Tylenol and ibuprofen without improvement of symptoms.  She denies fall, trauma, or injury.  She denies pain on the left side.  She denies fevers, chills, anterior chest pain, shortness of breath, nausea, vomiting, abdominal pain, urinary symptoms, abnormal bowel movements.  She has a history of scoliosis, which has been evaluated by orthopedics many years ago, but she does not have the money to get it corrected surgically.  She has discussed this with her primary care doctor, but no further evaluation has been done.  Patient is concerned about her kidneys, lung, and ribs. Pt denies recent travel, surgeries, trauma, immobilization, h/o ca, h/o dvt, or estrogen use.   HPI  Past Medical History:  Diagnosis Date  . Anxiety   . Arthritis   . Bipolar 1 disorder (HCC)   . Depression   . Headache 04/20/2015  . Near syncope 02/03/2015  . Panic   . Scoliosis     Patient Active Problem List   Diagnosis Date Noted  . Bipolar 1 disorder (HCC) 01/17/2016  . Biliary dyskinesia   . Generalized abdominal cramping   . Cholelithiases 05/17/2015  . Acute cholecystitis 05/17/2015  . RUQ pain   . Headache 04/20/2015  . Near syncope 02/03/2015    Past Surgical History:  Procedure Laterality Date  . CHOLECYSTECTOMY N/A 05/20/2015   Procedure: LAPAROSCOPIC CHOLECYSTECTOMY WITH ATTEMPTED INTRAOPERATIVE CHOLANGIOGRAM;  Surgeon: Violeta Gelinas, MD;  Location: [facility] OR;  Service: General;  Laterality: N/A;  . FRACTURE SURGERY     . TUBAL LIGATION       OB History   None      Home Medications    Prior to Admission medications   Medication Sig Start Date End Date Taking? Authorizing Provider  albuterol (PROVENTIL HFA;VENTOLIN HFA) 108 (90 Base) MCG/ACT inhaler Inhale 1-2 puffs into the lungs every 6 (six) hours as needed for wheezing. 04/18/16   Rolland Porter, MD  ALPRAZolam Prudy Feeler) 0.5 MG tablet Take 1 tablet (0.5 mg total) by mouth daily. 04/27/17 04/27/18  Arfeen, Phillips Grout, MD  ARIPiprazole (ABILIFY) 10 MG tablet Take 1 tablet (10 mg total) by mouth daily. 05/15/17 05/15/18  Arfeen, Phillips Grout, MD  benztropine (COGENTIN) 1 MG tablet Take 0.5 tablets (0.5 mg total) by mouth at bedtime. 05/30/17 05/30/18  Arfeen, Phillips Grout, MD  clonazePAM (KLONOPIN) 0.5 MG tablet Take 1 tablet (0.5 mg total) by mouth 2 (two) times daily as needed for anxiety. 05/15/17 05/15/18  Arfeen, Phillips Grout, MD  diclofenac sodium (VOLTAREN) 1 % GEL Apply 2 g topically 4 (four) times daily. 07/07/17   Grazia Taffe, PA-C  FLUoxetine (PROZAC) 10 MG capsule Take one capsule by mouth daily for one week then increase to two capsules a day 07/04/17   Arfeen, Phillips Grout, MD  meloxicam (MOBIC) 7.5 MG tablet Take 1 tablet (7.5 mg total) by mouth daily. 07/07/17   Erilyn Pearman, PA-C  promethazine (PHENERGAN) 25 MG tablet Take 1 tablet (25 mg total) by mouth  every 6 (six) hours as needed for nausea or vomiting. 03/26/17   Lawyer, Cristal Deer, PA-C  traMADol (ULTRAM) 50 MG tablet Take 1 tablet (50 mg total) by mouth every 6 (six) hours as needed for severe pain. 03/26/17   Charlestine Night, PA-C    Family History Family History  Adopted: Yes  Problem Relation Age of Onset  . Cancer Other   . Diabetes Other     Social History Social History   Tobacco Use  . Smoking status: Current Every Day Smoker    Packs/day: 0.40    Years: 22.00    Pack years: 8.80    Types: Cigarettes  . Smokeless tobacco: Never Used  . Tobacco comment: States has cut back and working to  quit  Substance Use Topics  . Alcohol use: No    Alcohol/week: 0.0 oz    Comment: States has quit completely  . Drug use: Yes    Types: Marijuana    Comment: States used marijuana      Allergies   Bupropion; Other; Vistaril [hydroxyzine]; and Chlorhexidine   Review of Systems Review of Systems  Musculoskeletal: Positive for back pain.  All other systems reviewed and are negative.    Physical Exam Updated Vital Signs BP 114/85 (BP Location: Right Arm)   Pulse (!) 103   Temp 98.1 F (36.7 C) (Oral)   Resp 11   LMP 06/28/2017   SpO2 97%   Physical Exam  Constitutional: She is oriented to person, place, and time. She appears well-developed and well-nourished. No distress.  In NAD  HENT:  Head: Normocephalic and atraumatic.  Eyes: EOM are normal.  Neck: Normal range of motion.  Cardiovascular: Regular rhythm and intact distal pulses. Tachycardia present.  tachycardic  Pulmonary/Chest: Effort normal and breath sounds normal. No respiratory distress. She has no wheezes.  Abdominal: Soft. She exhibits no distension and no mass. There is no tenderness. There is no guarding.  Musculoskeletal: Normal range of motion. She exhibits tenderness.  TTP of R sided back musculature. No flail chest or obvious deformity. Clear lung sounds in all fields. Speaking in full sentences. No CVA tenderness. No rashes  Neurological: She is alert and oriented to person, place, and time. No sensory deficit.  Skin: Skin is warm. No rash noted.  Psychiatric: She has a normal mood and affect.  Nursing note and vitals reviewed.    ED Treatments / Results  Labs (all labs ordered are listed, but only abnormal results are displayed) Labs Reviewed  BASIC METABOLIC PANEL - Abnormal; Notable for the following components:      Result Value   CO2 21 (*)    All other components within normal limits  CBC  I-STAT TROPONIN, ED  I-STAT BETA HCG BLOOD, ED ([facility], [facility], [facility] ONLY)    EKG EKG  Interpretation  Date/Time:  Saturday July 07 2017 15:55:16 EDT Ventricular Rate:  115 PR Interval:  180 QRS Duration: 76 QT Interval:  316 QTC Calculation: 437 R Axis:   60 Text Interpretation:  Sinus tachycardia Right atrial enlargement Borderline ECG Since last tracing rate faster Confirmed by Mancel Bale [id number]) on 07/07/2017 6:35:50 PM   Radiology Dg Ribs Unilateral W/chest Right  Result Date: 07/07/2017 CLINICAL DATA:  Right knee posterior rib pain the past 2-3 weeks. No known injury. EXAM: RIGHT RIBS AND CHEST - 3+ VIEW COMPARISON:  Chest x-ray dated May 10, 2016. FINDINGS: No fracture or other bone lesions are seen involving the ribs. There is no evidence of  pneumothorax or pleural effusion. Both lungs are clear. Heart size and mediastinal contours are within normal limits. Stable severe thoracic dextroscoliosis. IMPRESSION: Negative. Electronically Signed   By: Obie DredgeWilliam T Derry M.D.   On: 07/07/2017 16:50    Procedures Procedures (including critical care time)  Medications Ordered in ED Medications - No data to display   Initial Impression / Assessment and Plan / ED Course  I have reviewed the triage vital signs and the nursing notes.  Pertinent labs & imaging results that were available during my care of the patient were reviewed by me and considered in my medical decision making (see chart for details).  Clinical Course as of Jul 07 1848  Sat Jul 07, 2017  1835 normal  I-stat troponin, ED [EW]  1836 normal  CBC [EW]  1836 normal  I-Stat beta hCG blood, ED [EW]    Clinical Course User Index [EW] Mancel BaleWentz, Elliott, MD    Pt presenting for evaluation of right-sided back pain.  Physical exam reassuring, patient appears nontoxic.  No obvious injury.  Labs reassuring, troponin negative.  Kidney function stable.  X-ray reviewed and interpreted by me, no sign of fracture or underlying pulmonary infection.  I believe this is likely musculoskeletal, as it is  reproducible with palpation.  Patient has significant scoliosis, this is likely part of the cause.  At this time, doubt infection, ACS, pneumothorax, or PE.  Patient is mildly tachycardic on exam, appears anxious, and likely also due to pain. HR improved from initial assessment without intervention.  Discussed with patient treatment with anti-inflammatories and follow-up with orthopedics.  Patient is on multiple psychiatric medications, I am hesitant to give muscle relaxers at this time.  Discussed continued use of heating pads.  At this time, patient appears safe for discharge.  Return precautions given.  Patient states she understands and agrees to plan.  Final Clinical Impressions(s) / ED Diagnoses   Final diagnoses:  Right-sided thoracic back pain, unspecified chronicity    ED Discharge Orders        Ordered    meloxicam (MOBIC) 7.5 MG tablet  Daily     07/07/17 1843    diclofenac sodium (VOLTAREN) 1 % GEL  4 times daily     07/07/17 1843       Alveria ApleyCaccavale, Orchid Glassberg, PA-C 07/07/17 1857    Mancel BaleWentz, Elliott, MD 07/07/17 2253

## 2017-07-07 NOTE — Discharge Instructions (Addendum)
Take mobic once a day with meals.  Do not take other anti-inflammatories at the same time open (Advil, Motrin, ibuprofen, naproxen, Aleve). You may supplement with Tylenol if you need further pain control. Use voltaren gel up to 4 times a day as needed.  Continue using heating pads to help control your pain. Follow up with your primary care or the orthopedic doctor for further evaluation of your spine/back.  Return to the ER if you develop any new or concerning symptoms.

## 2017-07-16 ENCOUNTER — Ambulatory Visit (INDEPENDENT_AMBULATORY_CARE_PROVIDER_SITE_OTHER): Payer: Medicare PPO | Admitting: Psychiatry

## 2017-07-16 ENCOUNTER — Encounter (HOSPITAL_COMMUNITY): Payer: Self-pay | Admitting: Psychiatry

## 2017-07-16 VITALS — BP 94/68 | HR 91 | Ht 64.0 in | Wt 156.0 lb

## 2017-07-16 DIAGNOSIS — R450 Nervousness: Secondary | ICD-10-CM

## 2017-07-16 DIAGNOSIS — M549 Dorsalgia, unspecified: Secondary | ICD-10-CM

## 2017-07-16 DIAGNOSIS — F17210 Nicotine dependence, cigarettes, uncomplicated: Secondary | ICD-10-CM

## 2017-07-16 DIAGNOSIS — F4310 Post-traumatic stress disorder, unspecified: Secondary | ICD-10-CM | POA: Diagnosis not present

## 2017-07-16 DIAGNOSIS — F411 Generalized anxiety disorder: Secondary | ICD-10-CM

## 2017-07-16 MED ORDER — FLUOXETINE HCL 10 MG PO CAPS: ORAL_CAPSULE | ORAL | 1 refills | Status: DC

## 2017-07-16 MED ORDER — DIAZEPAM 2 MG PO TABS: ORAL_TABLET | ORAL | 0 refills | Status: DC
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
## 2017-07-25 ENCOUNTER — Telehealth (HOSPITAL_COMMUNITY): Payer: Self-pay

## 2017-07-25 NOTE — Telephone Encounter (Signed)
Patient is calling with concerns that she is not eating and has no desire to eat. Patient states she is down to 145 lbs and she is not trying to lose weight. Patient wants to know if this should be concerning

## 2017-07-26 ENCOUNTER — Other Ambulatory Visit (HOSPITAL_COMMUNITY): Payer: Self-pay | Admitting: Psychiatry

## 2017-07-26 NOTE — Telephone Encounter (Signed)
Does the increase Prozac causing nausea.  If increase Prozac causing nausea then she can go back to 20 mg.  If symptoms continue to persist then she should see medical doctor.

## 2017-07-27 NOTE — Telephone Encounter (Signed)
Called patient and left a detailed message letting her know what Dr. Lolly Mustache said.

## 2017-07-31 ENCOUNTER — Telehealth (HOSPITAL_COMMUNITY): Payer: Self-pay

## 2017-07-31 ENCOUNTER — Emergency Department (HOSPITAL_COMMUNITY)
Admission: EM | Admit: 2017-07-31 | Discharge: 2017-07-31 | Discharge status: Against Medical Advice | Payer: Medicare PPO

## 2017-07-31 NOTE — ED Notes (Signed)
Pt called for triage x1; no answer. [employer] 

## 2017-07-31 NOTE — Telephone Encounter (Signed)
Patient called, she is still very jittery and feeling out of it. Patient states she is going to [facility] ED for an assessment. I advised her to call me and let me know if she ended up inpatient. Just an [place]

## 2017-07-31 NOTE — ED Notes (Signed)
Pt called for triage x3; no answer. [employer]

## 2017-07-31 NOTE — ED Notes (Signed)
Pt called for triage x2; no answer. [employer]

## 2017-08-02 ENCOUNTER — Telehealth (HOSPITAL_COMMUNITY): Payer: Self-pay

## 2017-08-02 ENCOUNTER — Other Ambulatory Visit (HOSPITAL_COMMUNITY): Payer: Self-pay | Admitting: Psychiatry

## 2017-08-02 DIAGNOSIS — F411 Generalized anxiety disorder: Secondary | ICD-10-CM

## 2017-08-02 NOTE — Telephone Encounter (Signed)
She can go back on Klonopin but she need to stop the Valium.  She should see a therapist for anxiety.

## 2017-08-02 NOTE — Telephone Encounter (Signed)
Patient wants to know if she can start back on her Klonopin, she said she is still having bad anxiety and she feels all of her symptoms are related to coming off of the Klonopin. Please review and advise, thank you

## 2017-08-03 MED ORDER — CLONAZEPAM 0.5 MG PO TABS: 0.5000 mg | ORAL_TABLET | Freq: Two times a day (BID) | ORAL | 0 refills | Status: DC | PRN

## 2017-08-03 NOTE — Telephone Encounter (Signed)
Called patient and let her know that I called in her Klonopin and to stop the Xanax. Patient voiced her understanding

## 2017-08-21 ENCOUNTER — Ambulatory Visit (HOSPITAL_COMMUNITY): Payer: Medicare PPO | Admitting: Psychiatry

## 2017-08-27 ENCOUNTER — Ambulatory Visit (HOSPITAL_COMMUNITY): Payer: Self-pay | Admitting: Psychiatry

## 2017-09-03 ENCOUNTER — Other Ambulatory Visit (HOSPITAL_COMMUNITY): Payer: Self-pay

## 2017-09-03 DIAGNOSIS — F411 Generalized anxiety disorder: Secondary | ICD-10-CM

## 2017-09-13 ENCOUNTER — Encounter (HOSPITAL_COMMUNITY): Payer: Self-pay | Admitting: Psychiatry

## 2017-09-13 ENCOUNTER — Ambulatory Visit (INDEPENDENT_AMBULATORY_CARE_PROVIDER_SITE_OTHER): Payer: Medicare Other | Admitting: Psychiatry

## 2017-09-13 VITALS — BP 128/74 | HR 74 | Ht 64.0 in | Wt 146.0 lb

## 2017-09-13 DIAGNOSIS — F411 Generalized anxiety disorder: Secondary | ICD-10-CM | POA: Diagnosis not present

## 2017-09-13 DIAGNOSIS — F319 Bipolar disorder, unspecified: Secondary | ICD-10-CM | POA: Diagnosis not present

## 2017-09-13 MED ORDER — ALPRAZOLAM ER 1 MG PO TB24
1.0000 mg | ORAL_TABLET | Freq: Every day | ORAL | 1 refills | Status: DC
Start: 2017-09-13 — End: 2017-11-13

## 2017-09-13 MED ORDER — GABAPENTIN 400 MG PO CAPS: 400.0000 mg | ORAL_CAPSULE | Freq: Two times a day (BID) | ORAL | 1 refills | Status: DC
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
## 2017-09-18 DIAGNOSIS — L720 Epidermal cyst: Secondary | ICD-10-CM | POA: Diagnosis not present

## 2017-09-18 DIAGNOSIS — L089 Local infection of the skin and subcutaneous tissue, unspecified: Secondary | ICD-10-CM | POA: Diagnosis not present

## 2017-09-18 DIAGNOSIS — M25552 Pain in left hip: Secondary | ICD-10-CM | POA: Diagnosis not present

## 2017-09-18 DIAGNOSIS — J4520 Mild intermittent asthma, uncomplicated: Secondary | ICD-10-CM | POA: Diagnosis not present

## 2017-10-25 ENCOUNTER — Other Ambulatory Visit (HOSPITAL_COMMUNITY): Payer: Self-pay | Admitting: Psychiatry

## 2017-11-13 ENCOUNTER — Encounter (HOSPITAL_COMMUNITY): Payer: Self-pay | Admitting: Psychiatry

## 2017-11-13 ENCOUNTER — Ambulatory Visit (INDEPENDENT_AMBULATORY_CARE_PROVIDER_SITE_OTHER): Payer: Medicare HMO | Admitting: Psychiatry

## 2017-11-13 VITALS — BP 126/74 | HR 80 | Ht 64.0 in | Wt 153.0 lb

## 2017-11-13 DIAGNOSIS — G4700 Insomnia, unspecified: Secondary | ICD-10-CM

## 2017-11-13 DIAGNOSIS — F319 Bipolar disorder, unspecified: Secondary | ICD-10-CM

## 2017-11-13 DIAGNOSIS — F411 Generalized anxiety disorder: Secondary | ICD-10-CM | POA: Insufficient documentation

## 2017-11-13 DIAGNOSIS — Z79899 Other long term (current) drug therapy: Secondary | ICD-10-CM

## 2017-11-13 DIAGNOSIS — F4310 Post-traumatic stress disorder, unspecified: Secondary | ICD-10-CM

## 2017-11-13 MED ORDER — ALPRAZOLAM ER 1 MG PO TB24: 1.0000 mg | ORAL_TABLET | Freq: Every day | ORAL | 0 refills | Status: DC

## 2017-11-13 MED ORDER — GABAPENTIN 400 MG PO CAPS: 400.0000 mg | ORAL_CAPSULE | Freq: Two times a day (BID) | ORAL | 0 refills | Status: DC
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
## 2017-11-13 NOTE — Patient Instructions (Signed)
Take the Xanax XR when you wake up.  Then take the Gabapentin in the afternoon when you are starting to feel ill (between 3-6 PM) and can have a second dose in the evening.  Take melatonin after dinner (9:30- 10:00 PM) and target bedtime at midnight

## 2018-02-13 ENCOUNTER — Encounter (HOSPITAL_COMMUNITY): Payer: Self-pay | Admitting: Radiology

## 2018-02-13 ENCOUNTER — Emergency Department (HOSPITAL_COMMUNITY): Payer: Medicare HMO

## 2018-02-13 ENCOUNTER — Ambulatory Visit (HOSPITAL_COMMUNITY): Payer: Self-pay | Admitting: Psychiatry

## 2018-02-13 ENCOUNTER — Other Ambulatory Visit: Payer: Self-pay

## 2018-02-13 ENCOUNTER — Emergency Department (HOSPITAL_COMMUNITY)
Admission: EM | Admit: 2018-02-13 | Discharge: 2018-02-13 | Disposition: A | Discharge status: Home / Self Care | Payer: Medicare HMO | Attending: Emergency Medicine | Admitting: Emergency Medicine

## 2018-02-13 DIAGNOSIS — R51 Headache: Secondary | ICD-10-CM | POA: Insufficient documentation

## 2018-02-13 DIAGNOSIS — F17210 Nicotine dependence, cigarettes, uncomplicated: Secondary | ICD-10-CM | POA: Insufficient documentation

## 2018-02-13 DIAGNOSIS — Z79899 Other long term (current) drug therapy: Secondary | ICD-10-CM | POA: Diagnosis not present

## 2018-02-13 DIAGNOSIS — R079 Chest pain, unspecified: Secondary | ICD-10-CM | POA: Diagnosis not present

## 2018-02-13 DIAGNOSIS — R42 Dizziness and giddiness: Secondary | ICD-10-CM | POA: Diagnosis not present

## 2018-02-13 DIAGNOSIS — R519 Headache, unspecified: Secondary | ICD-10-CM

## 2018-02-13 LAB — CBC WITH DIFFERENTIAL/PLATELET
Abs Immature Granulocytes: 0.05 10*3/uL (ref 0.00–0.07)
Basophils Absolute: 0 10*3/uL (ref 0.0–0.1)
Basophils Relative: 0 %
Eosinophils Absolute: 0.1 10*3/uL (ref 0.0–0.5)
Eosinophils Relative: 0 %
HCT: 43.7 % (ref 36.0–46.0)
Hemoglobin: 14.1 g/dL (ref 12.0–15.0)
Immature Granulocytes: 0 %
Lymphocytes Relative: 21 %
Lymphs Abs: 2.7 10*3/uL (ref 0.7–4.0)
MCH: 29.6 pg (ref 26.0–34.0)
MCHC: 32.3 g/dL (ref 30.0–36.0)
MCV: 91.8 fL (ref 80.0–100.0)
Monocytes Absolute: 0.5 10*3/uL (ref 0.1–1.0)
Monocytes Relative: 4 %
Neutro Abs: 9.6 10*3/uL — ABNORMAL HIGH (ref 1.7–7.7)
Neutrophils Relative %: 75 %
Platelets: 331 10*3/uL (ref 150–400)
RBC: 4.76 MIL/uL (ref 3.87–5.11)
RDW: 13 % (ref 11.5–15.5)
WBC: 13 10*3/uL — ABNORMAL HIGH (ref 4.0–10.5)
nRBC: 0 % (ref 0.0–0.2)

## 2018-02-13 LAB — BASIC METABOLIC PANEL
Anion gap: 10 (ref 5–15)
BUN: 9 mg/dL (ref 6–20)
CO2: 24 mmol/L (ref 22–32)
Calcium: 8.9 mg/dL (ref 8.9–10.3)
Chloride: 106 mmol/L (ref 98–111)
Creatinine, Ser: 0.6 mg/dL (ref 0.44–1.00)
GFR calc Af Amer: >60 mL/min (ref >60)
GFR calc non Af Amer: >60 mL/min (ref >60)
Glucose, Bld: 93 mg/dL (ref 70–99)
Potassium: 3.5 mmol/L (ref 3.5–5.1)
Sodium: 140 mmol/L (ref 135–145)

## 2018-02-13 LAB — I-STAT TROPONIN, ED: Troponin i, poc: 0 ng/mL (ref 0.00–0.08)

## 2018-02-13 LAB — RAPID URINE DRUG SCREEN, HOSP PERFORMED
Amphetamines: NOT DETECTED
Barbiturates: NOT DETECTED
Benzodiazepines: POSITIVE — AB
Cocaine: NOT DETECTED
Opiates: NOT DETECTED
Tetrahydrocannabinol: POSITIVE — AB

## 2018-02-13 LAB — I-STAT BETA HCG BLOOD, ED (MC, WL, AP ONLY): I-stat hCG, quantitative: <5 m[IU]/mL (ref <5)

## 2018-02-13 MED ORDER — METOCLOPRAMIDE HCL 5 MG/ML IJ SOLN
10.0000 mg | Freq: Once | INTRAMUSCULAR | Status: AC
  Administered 2018-02-13: 10 mg via INTRAVENOUS
  Filled 2018-02-13: qty 2

## 2018-02-13 MED ORDER — KETOROLAC TROMETHAMINE 30 MG/ML IJ SOLN
30.0000 mg | Freq: Once | INTRAMUSCULAR | Status: AC
  Administered 2018-02-13: 30 mg via INTRAVENOUS
  Filled 2018-02-13: qty 1

## 2018-02-13 MED ORDER — SODIUM CHLORIDE 0.9 % IV BOLUS
1000.0000 mL | Freq: Once | INTRAVENOUS | Status: AC
  Administered 2018-02-13: 1000 mL via INTRAVENOUS

## 2018-02-13 NOTE — ED Notes (Signed)
Family at bedside. 

## 2018-02-13 NOTE — Discharge Instructions (Signed)
Please follow-up with the neurologist for further evaluation and treatment of your ongoing head pressure.  You can take ibuprofen or Tylenol as prescribed over-the-counter, as needed for pain.  Please return the emergency department immediately if you develop any new or worsening symptoms.  I recommend trying to quit smoking, as this will help your cough and also help prevent your headaches if they are migraines.

## 2018-02-13 NOTE — ED Notes (Signed)
Attempted an IV x 2 (Left hand and right forearm) with no success. Will inform Latanya PresserKelly G., RN.

## 2018-02-13 NOTE — ED Notes (Signed)
U/A and culture sent to lab. [facility]Miles 

## 2018-02-13 NOTE — ED Provider Notes (Signed)
[facility]-EMERGENCY DEPT Provider Note   CSN: [id number] Arrival date & time: 02/13/18  1411     History   Chief Complaint Chief Complaint  Patient presents with  . Head Pressure    HPI Kimberly Lynch is a 32 y.o. female with history of anxiety, depression, bipolar 1 disorder, headaches who presents with acute on chronic "head pressure." Patient reports she has experienced this sensation for the past several years, however had an episode last night that was very severe and caused her to be confused and stumble on her feet.  She reports continued soreness to her scalp.  She feels like there is pressure on top of her head.  She does not call it a headache.  She has had photophobia.  She denies any nausea or vomiting, abdominal pain.  She also reports a cough for several months and intermittent sharp, fleeting chest pain that last for a few seconds and goes away.  She has had some associated shortness of breath.  She smokes cigarettes.  She denies any drug or alcohol use except alcohol every now and then.  She saw neurology 5 years ago and had MRI and was told she had few spots on her brain.  Per chart review, there were hyperdensities that were nonspecific.  HPI  Past Medical History:  Diagnosis Date  . Anxiety   . Arthritis   . Bipolar 1 disorder (HCC)   . Depression   . Headache 04/20/2015  . Near syncope 02/03/2015  . Panic   . Scoliosis     Patient Active Problem List   Diagnosis Date Noted  . Insomnia 11/13/2017  . Generalized anxiety disorder 11/13/2017  . Paresthesia of both lower extremities 07/27/2016  . Scoliosis 07/19/2016  . Polyarthralgia 05/03/2016  . High risk sexual behavior 05/03/2016  . Bipolar 1 disorder (HCC) 01/17/2016  . Headache 04/20/2015    Past Surgical History:  Procedure Laterality Date  . CHOLECYSTECTOMY N/A 05/20/2015   Procedure: LAPAROSCOPIC CHOLECYSTECTOMY WITH ATTEMPTED INTRAOPERATIVE CHOLANGIOGRAM;  Surgeon: Violeta Gelinas, MD;  Location: [facility] OR;  Service: General;  Laterality: N/A;  . FRACTURE SURGERY    . TUBAL LIGATION       OB History   None      Home Medications    Prior to Admission medications   Medication Sig Start Date End Date Taking? Authorizing Provider  ALPRAZolam (XANAX XR) 1 MG 24 hr tablet Take 1 tablet (1 mg total) by mouth daily. 3 month supply 11/13/17  Yes Mariel Craft, MD  albuterol (PROVENTIL HFA;VENTOLIN HFA) 108 (90 Base) MCG/ACT inhaler Inhale 1-2 puffs into the lungs every 6 (six) hours as needed for wheezing. Patient not taking: Reported on 02/13/2018 04/18/16   Rolland Porter, MD  gabapentin (NEURONTIN) 400 MG capsule Take 1 capsule (400 mg total) by mouth 2 (two) times daily. Patient not taking: Reported on 02/13/2018 11/13/17   Mariel Craft, MD    Family History Family History  Adopted: Yes  Problem Relation Age of Onset  . Cancer Other   . Diabetes Other     Social History Social History   Tobacco Use  . Smoking status: Current Every Day Smoker    Packs/day: 0.40    Years: 22.00    Pack years: 8.80    Types: Cigarettes  . Smokeless tobacco: Never Used  . Tobacco comment: States has cut back and working to quit  Substance Use Topics  . Alcohol use: No  Alcohol/week: 0.0 standard drinks    Comment: States has quit completely  . Drug use: Yes    Types: Marijuana    Comment: States used marijuana      Allergies   Bupropion; Other; Vistaril [hydroxyzine]; and Chlorhexidine   Review of Systems Review of Systems  Constitutional: Negative for chills and fever.  HENT: Negative for facial swelling and sore throat.   Respiratory: Positive for cough and shortness of breath.   Cardiovascular: Positive for chest pain.  Gastrointestinal: Negative for abdominal pain, nausea and vomiting.  Genitourinary: Negative for dysuria.  Musculoskeletal: Negative for back pain.  Skin: Negative for rash and wound.  Neurological: Positive for dizziness  (intermittent) and headaches.  Psychiatric/Behavioral: The patient is not nervous/anxious.      Physical Exam Updated Vital Signs BP 117/85 (BP Location: Left Arm)   Pulse 91   Temp 98.3 F (36.8 C)   Resp 18   Ht 5\' 4"  (1.626 m)   Wt 66.7 kg   LMP 01/13/2018   SpO2 100%   BMI 25.23 kg/m   Physical Exam  Constitutional: She appears well-developed and well-nourished. No distress.  HENT:  Head: Normocephalic and atraumatic.  Mouth/Throat: Oropharynx is clear and moist. No oropharyngeal exudate.  Eyes: Pupils are equal, round, and reactive to light. Conjunctivae and EOM are normal. Right eye exhibits no discharge. Left eye exhibits no discharge. No scleral icterus.  Neck: Normal range of motion. Neck supple. No spinous process tenderness and no muscular tenderness present. No neck rigidity. Normal range of motion present. No thyromegaly present.  Cardiovascular: Normal rate, regular rhythm, normal heart sounds and intact distal pulses. Exam reveals no gallop and no friction rub.  No murmur heard. Pulmonary/Chest: Effort normal and breath sounds normal. No stridor. No respiratory distress. She has no wheezes. She has no rales.  Abdominal: Soft. Bowel sounds are normal. She exhibits no distension. There is no tenderness. There is no rebound and no guarding.  Musculoskeletal: She exhibits no edema.  Lymphadenopathy:    She has no cervical adenopathy.  Neurological: She is alert. Coordination normal.  CN 3-12 intact; normal sensation throughout; 5/5 strength in all 4 extremities; equal bilateral grip strength  Skin: Skin is warm and dry. No rash noted. She is not diaphoretic. No pallor.  Psychiatric: Her affect is blunt.  Nursing note and vitals reviewed.    ED Treatments / Results  Labs (all labs ordered are listed, but only abnormal results are displayed) Labs Reviewed  CBC WITH DIFFERENTIAL/PLATELET - Abnormal; Notable for the following components:      Result Value   WBC  13.0 (*)    Neutro Abs 9.6 (*)    All other components within normal limits  RAPID URINE DRUG SCREEN, HOSP PERFORMED - Abnormal; Notable for the following components:   Benzodiazepines POSITIVE (*)    Tetrahydrocannabinol POSITIVE (*)    All other components within normal limits  BASIC METABOLIC PANEL  I-STAT TROPONIN, ED  I-STAT BETA HCG BLOOD, ED ([facility], [facility], [facility] ONLY)    EKG None  Radiology Dg Chest 2 View  Result Date: 02/13/2018 CLINICAL DATA:  Head pressure. EXAM: CHEST - 2 VIEW COMPARISON:  07/07/2017. FINDINGS: The heart size and mediastinal contours are within normal limits. Both lungs are clear. Stable severe thoracic dextroscoliosis. IMPRESSION: No active cardiopulmonary disease. Electronically Signed   By: Elsie Stain M.D.   On: 02/13/2018 20:21   Ct Head Wo Contrast  Result Date: 02/13/2018 CLINICAL DATA:  Head pressure. EXAM:  CT HEAD WITHOUT CONTRAST TECHNIQUE: Contiguous axial images were obtained from the base of the skull through the vertex without intravenous contrast. COMPARISON:  MRI of the brain March 14, 2016 FINDINGS: Brain: No subdural, epidural, or subarachnoid hemorrhage. Cerebellum, brainstem, and basal cisterns are normal. Ventricles and sulci are unremarkable. No midline shift. Low-attenuation in the right frontal white matter on series 2, image 12 corresponds to increased T2 signal in this region on the MRI from December 2017. Focal low-attenuation in the left corona radiata on also correlates with increased T2 signal on the previous MRI. No new white matter changes. No acute cortical ischemia or infarct. No mass effect or midline shift. Vascular: No hyperdense vessel or unexpected calcification. Skull: Normal. Negative for fracture or focal lesion. Sinuses/Orbits: No acute finding. Other: None. IMPRESSION: 1. No acute intracranial abnormalities identified. 2. Low-attenuation region in the white matter of the right frontal lobe and the left corona radiata is  stable since December 2017. No interval changes in the white matter. Electronically Signed   By: Gerome Samavid  Williams III M.D   On: 02/13/2018 20:18    Procedures Procedures (including critical care time)  Medications Ordered in ED Medications  sodium chloride 0.9 % bolus 1,000 mL (0 mLs Intravenous Stopped 02/13/18 2227)  ketorolac (TORADOL) 30 MG/ML injection 30 mg (30 mg Intravenous Given 02/13/18 2136)  metoCLOPramide (REGLAN) injection 10 mg (10 mg Intravenous Given 02/13/18 2136)     Initial Impression / Assessment and Plan / ED Course  I have reviewed the triage vital signs and the nursing notes.  Pertinent labs & imaging results that were available during my care of the patient were reviewed by me and considered in my medical decision making (see chart for details).     Patient presenting with headache since last evening.  Labs are unremarkable except for mild leukocytosis at 13.0.  No signs of infection.  Chest x-ray is clear.  Patient has no urinary symptoms.  Suspect related to pain and stress.  Patient reports she has been under a lot of stress and arguing with her husband a lot the past day.  I suspect patient may be suffering from migraines and she needs neurology follow-up.  CT of the head is stable from last MRI in 2017.  Neuro exam is normal and nonfocal on my evaluation.  Patient is feeling much improved after migraine cocktail in the ED.  She would like to go home.  Return precautions discussed.  Patient understands and agrees with plan.  Patient vitals stable throughout ED course and discharged in satisfactory condition. I discussed patient case with Dr. Rubin PayorPickering who guided the patient's management and agrees with plan.   Final Clinical Impressions(s) / ED Diagnoses   Final diagnoses:  Pressure in head    ED Discharge Orders    None       Emi HolesLaw, Guiseppe Flanagan M, PA-C 02/13/18 2255    Benjiman CorePickering, Nathan, MD 02/13/18 [id number]
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
## 2018-02-28 ENCOUNTER — Ambulatory Visit (HOSPITAL_COMMUNITY): Payer: Medicare HMO | Admitting: Psychiatry

## 2018-03-28 ENCOUNTER — Ambulatory Visit (INDEPENDENT_AMBULATORY_CARE_PROVIDER_SITE_OTHER): Payer: Medicare HMO | Admitting: Psychiatry

## 2018-03-28 ENCOUNTER — Encounter (HOSPITAL_COMMUNITY): Payer: Self-pay | Admitting: Psychiatry

## 2018-03-28 VITALS — BP 110/76 | HR 95 | Ht 64.0 in | Wt 147.0 lb

## 2018-03-28 DIAGNOSIS — F411 Generalized anxiety disorder: Secondary | ICD-10-CM

## 2018-03-28 DIAGNOSIS — F4310 Post-traumatic stress disorder, unspecified: Secondary | ICD-10-CM

## 2018-03-28 DIAGNOSIS — F609 Personality disorder, unspecified: Secondary | ICD-10-CM

## 2018-03-28 DIAGNOSIS — F319 Bipolar disorder, unspecified: Secondary | ICD-10-CM

## 2018-03-28 MED ORDER — BUSPIRONE HCL 5 MG PO TABS: 5.0000 mg | ORAL_TABLET | Freq: Two times a day (BID) | ORAL | 1 refills | Status: DC

## 2018-03-28 MED ORDER — LITHIUM CARBONATE ER 300 MG PO TBCR: 300.0000 mg | EXTENDED_RELEASE_TABLET | Freq: Two times a day (BID) | ORAL | 1 refills | Status: DC
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
## 2018-04-04 ENCOUNTER — Telehealth (HOSPITAL_COMMUNITY): Payer: Self-pay

## 2018-04-04 NOTE — Telephone Encounter (Signed)
She just started taking BuSpar few days ago.  She did to give at least 2 to 3 weeks to see the response.

## 2018-04-04 NOTE — Telephone Encounter (Signed)
Patient is calling to report that the Buspar is not working and she would like to go back on Klonopin or Xanax. Please review and advise, thank you

## 2018-04-09 NOTE — Telephone Encounter (Signed)
Patient called back to say that she can not take the Buspar it is making her anxiety worse. Patient is again requesting to go back on Xanax, she feels that is the only thing that will help. Please review and advise, thank you

## 2018-04-09 NOTE — Telephone Encounter (Signed)
Message left for patient to continue the Buspar for a little longer to see if it works. My number was left for any questions

## 2018-04-11 NOTE — Telephone Encounter (Signed)
Will not provide Xanax but we can provide Klonopin.  0.5 mg as needed for severe anxiety and insomnia.  Not to exceed more than 30 pills a month.  Stop the BuSpar.

## 2018-04-16 MED ORDER — CLONAZEPAM 0.5 MG PO TABS: 0.5000 mg | ORAL_TABLET | Freq: Every day | ORAL | 1 refills | Status: DC

## 2018-04-16 NOTE — Telephone Encounter (Signed)
I called patients pharmacy and gave a verbal to the pharmacist for the Klonopin, I discontinued the Buspar. Patient was called, voicemail left with the instructions and my call back number

## 2018-04-16 NOTE — Addendum Note (Signed)
Addended by: Rosalita Levan on: 04/16/2018 09:21 AM   Modules accepted: Orders

## 2018-04-22 ENCOUNTER — Ambulatory Visit (HOSPITAL_COMMUNITY): Payer: Medicare HMO | Admitting: Licensed Clinical Social Worker

## 2018-05-15 ENCOUNTER — Ambulatory Visit (HOSPITAL_COMMUNITY): Payer: Medicare HMO | Admitting: Psychiatry

## 2018-05-17 ENCOUNTER — Emergency Department (HOSPITAL_BASED_OUTPATIENT_CLINIC_OR_DEPARTMENT_OTHER): Payer: Medicare HMO

## 2018-05-17 ENCOUNTER — Emergency Department (HOSPITAL_BASED_OUTPATIENT_CLINIC_OR_DEPARTMENT_OTHER)
Admission: EM | Admit: 2018-05-17 | Discharge: 2018-05-17 | Disposition: A | Discharge status: Home / Self Care | Payer: Medicare HMO | Attending: Emergency Medicine | Admitting: Emergency Medicine

## 2018-05-17 ENCOUNTER — Encounter (HOSPITAL_BASED_OUTPATIENT_CLINIC_OR_DEPARTMENT_OTHER): Payer: Self-pay | Admitting: *Deleted

## 2018-05-17 ENCOUNTER — Other Ambulatory Visit: Payer: Self-pay

## 2018-05-17 DIAGNOSIS — R079 Chest pain, unspecified: Secondary | ICD-10-CM | POA: Diagnosis not present

## 2018-05-17 DIAGNOSIS — F17210 Nicotine dependence, cigarettes, uncomplicated: Secondary | ICD-10-CM | POA: Insufficient documentation

## 2018-05-17 DIAGNOSIS — N3000 Acute cystitis without hematuria: Secondary | ICD-10-CM | POA: Insufficient documentation

## 2018-05-17 DIAGNOSIS — R1013 Epigastric pain: Secondary | ICD-10-CM

## 2018-05-17 DIAGNOSIS — R000 Tachycardia, unspecified: Secondary | ICD-10-CM | POA: Diagnosis not present

## 2018-05-17 DIAGNOSIS — R0602 Shortness of breath: Secondary | ICD-10-CM | POA: Diagnosis not present

## 2018-05-17 LAB — URINALYSIS, ROUTINE W REFLEX MICROSCOPIC
Bilirubin Urine: NEGATIVE
Glucose, UA: NEGATIVE mg/dL
Hgb urine dipstick: NEGATIVE
Ketones, ur: NEGATIVE mg/dL
Nitrite: NEGATIVE
Protein, ur: NEGATIVE mg/dL
SPECIFIC GRAVITY, URINE: 1.025 (ref 1.005–1.030)
pH: 6 (ref 5.0–8.0)

## 2018-05-17 LAB — COMPREHENSIVE METABOLIC PANEL
ALT: 18 U/L (ref 0–44)
AST: 18 U/L (ref 15–41)
Albumin: 4.3 g/dL (ref 3.5–5.0)
Alkaline Phosphatase: 71 U/L (ref 38–126)
Anion gap: 9 (ref 5–15)
BUN: 12 mg/dL (ref 6–20)
CALCIUM: 8.8 mg/dL — AB (ref 8.9–10.3)
CO2: 21 mmol/L — ABNORMAL LOW (ref 22–32)
CREATININE: 0.63 mg/dL (ref 0.44–1.00)
Chloride: 106 mmol/L (ref 98–111)
GFR calc Af Amer: >60 mL/min (ref >60)
Glucose, Bld: 93 mg/dL (ref 70–99)
Potassium: 3.3 mmol/L — ABNORMAL LOW (ref 3.5–5.1)
Sodium: 136 mmol/L (ref 135–145)
Total Bilirubin: 0.3 mg/dL (ref 0.3–1.2)
Total Protein: 7.4 g/dL (ref 6.5–8.1)

## 2018-05-17 LAB — CBC WITH DIFFERENTIAL/PLATELET
Abs Immature Granulocytes: 0.03 10*3/uL (ref 0.00–0.07)
Basophils Absolute: 0 10*3/uL (ref 0.0–0.1)
Basophils Relative: 1 %
Eosinophils Absolute: 0.1 10*3/uL (ref 0.0–0.5)
Eosinophils Relative: 1 %
HCT: 45.9 % (ref 36.0–46.0)
Hemoglobin: 14.7 g/dL (ref 12.0–15.0)
Immature Granulocytes: 0 %
Lymphocytes Relative: 29 %
Lymphs Abs: 2.3 10*3/uL (ref 0.7–4.0)
MCH: 29.6 pg (ref 26.0–34.0)
MCHC: 32 g/dL (ref 30.0–36.0)
MCV: 92.5 fL (ref 80.0–100.0)
MONOS PCT: 7 %
Monocytes Absolute: 0.5 10*3/uL (ref 0.1–1.0)
Neutro Abs: 4.9 10*3/uL (ref 1.7–7.7)
Neutrophils Relative %: 62 %
Platelets: 285 10*3/uL (ref 150–400)
RBC: 4.96 MIL/uL (ref 3.87–5.11)
RDW: 12.6 % (ref 11.5–15.5)
WBC: 7.8 10*3/uL (ref 4.0–10.5)
nRBC: 0 % (ref 0.0–0.2)

## 2018-05-17 LAB — RAPID URINE DRUG SCREEN, HOSP PERFORMED
Amphetamines: NOT DETECTED
Barbiturates: NOT DETECTED
Benzodiazepines: POSITIVE — AB
COCAINE: NOT DETECTED
Opiates: NOT DETECTED
Tetrahydrocannabinol: POSITIVE — AB

## 2018-05-17 LAB — URINALYSIS, MICROSCOPIC (REFLEX): RBC / HPF: NONE SEEN RBC/hpf (ref 0–5)

## 2018-05-17 LAB — PREGNANCY, URINE: Preg Test, Ur: NEGATIVE

## 2018-05-17 LAB — D-DIMER, QUANTITATIVE: D-Dimer, Quant: 0.38 ug/mL-FEU (ref 0.00–0.50)

## 2018-05-17 LAB — TROPONIN I: Troponin I: <0.03 ng/mL (ref <0.03)

## 2018-05-17 LAB — LIPASE, BLOOD: Lipase: 26 U/L (ref 11–51)

## 2018-05-17 MED ORDER — SUCRALFATE 1 G PO TABS: 1.0000 g | ORAL_TABLET | Freq: Three times a day (TID) | ORAL | 0 refills | Status: DC

## 2018-05-17 MED ORDER — POTASSIUM CHLORIDE CRYS ER 20 MEQ PO TBCR
40.0000 meq | EXTENDED_RELEASE_TABLET | Freq: Once | ORAL | Status: AC
  Administered 2018-05-17: 40 meq via ORAL
  Filled 2018-05-17: qty 2

## 2018-05-17 MED ORDER — SODIUM CHLORIDE 0.9 % IV BOLUS
1000.0000 mL | Freq: Once | INTRAVENOUS | Status: AC
  Administered 2018-05-17: 1000 mL via INTRAVENOUS

## 2018-05-17 MED ORDER — CEPHALEXIN 250 MG PO CAPS
500.0000 mg | ORAL_CAPSULE | Freq: Once | ORAL | Status: AC
  Administered 2018-05-17: 500 mg via ORAL
  Filled 2018-05-17: qty 2

## 2018-05-17 MED ORDER — CEPHALEXIN 500 MG PO CAPS: 500.0000 mg | ORAL_CAPSULE | Freq: Three times a day (TID) | ORAL | 0 refills | Status: DC

## 2018-05-17 MED ORDER — IOPAMIDOL (ISOVUE-300) INJECTION 61%
100.0000 mL | Freq: Once | INTRAVENOUS | Status: AC
  Administered 2018-05-17: 100 mL via INTRAVENOUS

## 2018-05-17 MED ORDER — ONDANSETRON HCL 4 MG/2ML IJ SOLN: 4.0000 mg | Freq: Once | INTRAMUSCULAR | Status: DC

## 2018-05-17 NOTE — Discharge Instructions (Addendum)
Take keflex three times daily for a week for UTI.   Take carafate three times daily to help with your stomach.   See GI doctor for follow up   Return to ER if you have worse abdominal pain, vomiting, fever, flank pain.

## 2018-05-17 NOTE — ED Notes (Signed)
1 unsuccessful IV attempt.  MD notified.  RT attempted to obtain IV access via ultrasound, unsuccessful.  MD will obtain an IV access via ultrasound.

## 2018-05-17 NOTE — Progress Notes (Signed)
CT pending due to IV/Labs

## 2018-05-17 NOTE — ED Provider Notes (Signed)
[facility] EMERGENCY DEPARTMENT Provider Note   CSN: [id number] Arrival date & time: 05/17/18  1813    History   Chief Complaint No chief complaint on file.   HPI Kimberly Lynch is a 33 y.o. female history of bipolar, anxiety, previous cholecystectomy here presenting with abdominal pain.  Patient states that she has epigastric pain and flank pain and right upper quadrant pain for the last 3 weeks.  Pain is intermittent and crampy in nature.  Associated vomiting only in the mornings.  Denies any fevers or chills.  Denies any urinary symptoms or fever.  Patient has some subjective shortness of breath and chest pain that has been going on for the last several weeks as well.  Denies any recent travel or history of blood clots.      The history is provided by the patient.    Past Medical History:  Diagnosis Date  . Anxiety   . Arthritis   . Bipolar 1 disorder (HCC)   . Depression   . Headache 04/20/2015  . Near syncope 02/03/2015  . Panic   . Scoliosis     Patient Active Problem List   Diagnosis Date Noted  . Insomnia 11/13/2017  . Generalized anxiety disorder 11/13/2017  . Paresthesia of both lower extremities 07/27/2016  . Scoliosis 07/19/2016  . Polyarthralgia 05/03/2016  . High risk sexual behavior 05/03/2016  . Bipolar 1 disorder (HCC) 01/17/2016  . Headache 04/20/2015    Past Surgical History:  Procedure Laterality Date  . CHOLECYSTECTOMY N/A 05/20/2015   Procedure: LAPAROSCOPIC CHOLECYSTECTOMY WITH ATTEMPTED INTRAOPERATIVE CHOLANGIOGRAM;  Surgeon: Violeta Gelinas, MD;  Location: [facility] OR;  Service: General;  Laterality: N/A;  . FRACTURE SURGERY    . TUBAL LIGATION       OB History   No obstetric history on file.      Home Medications    Prior to Admission medications   Medication Sig Start Date End Date Taking? Authorizing Provider  clonazePAM (KLONOPIN) 0.5 MG tablet Take 1 tablet (0.5 mg total) by mouth daily. Take only for extreme anxiety - do  not exceed one tablet a day 04/16/18 04/16/19  Arfeen, Phillips Grout, MD  lithium carbonate (LITHOBID) 300 MG CR tablet Take 1 tablet (300 mg total) by mouth 2 (two) times daily. 03/28/18 03/28/19  Arfeen, Phillips Grout, MD    Family History Family History  Adopted: Yes  Problem Relation Age of Onset  . Cancer Other   . Diabetes Other     Social History Social History   Tobacco Use  . Smoking status: Current Every Day Smoker    Packs/day: 0.40    Years: 22.00    Pack years: 8.80    Types: Cigarettes  . Smokeless tobacco: Never Used  . Tobacco comment: States has cut back and working to quit  Substance Use Topics  . Alcohol use: No    Alcohol/week: 0.0 standard drinks    Comment: States has quit completely  . Drug use: Yes    Types: Marijuana    Comment: States used marijuana      Allergies   Bupropion; Other; Vistaril [hydroxyzine]; and Chlorhexidine   Review of Systems Review of Systems  Respiratory: Positive for shortness of breath.   Cardiovascular: Positive for chest pain.  Gastrointestinal: Positive for abdominal pain.  All other systems reviewed and are negative.    Physical Exam Updated Vital Signs BP 120/86   Pulse 93   Temp 98.3 F (36.8 C) (Oral)   Resp  19   Ht 5\' 4"  (1.626 m)   Wt 67.6 kg   LMP 04/26/2018   SpO2 100%   BMI 25.58 kg/m   Physical Exam Vitals signs and nursing note reviewed.  Constitutional:      Comments: Anxious   HENT:     Head: Normocephalic.     Nose: Nose normal.     Mouth/Throat:     Mouth: Mucous membranes are moist.  Eyes:     Extraocular Movements: Extraocular movements intact.     Pupils: Pupils are equal, round, and reactive to light.  Neck:     Musculoskeletal: Normal range of motion.  Cardiovascular:     Rate and Rhythm: Regular rhythm. Tachycardia present.  Pulmonary:     Effort: Pulmonary effort is normal.     Breath sounds: Normal breath sounds.  Abdominal:     General: Abdomen is flat. Bowel sounds are normal.      Comments: Mild epigastric tenderness and RUQ tenderness and mild R CVAT   Musculoskeletal: Normal range of motion.  Skin:    General: Skin is warm.     Capillary Refill: Capillary refill takes less than 2 seconds.  Neurological:     General: No focal deficit present.     Mental Status: She is alert and oriented to person, place, and time. Mental status is at baseline.  Psychiatric:        Mood and Affect: Mood normal.        Behavior: Behavior normal.      ED Treatments / Results  Labs (all labs ordered are listed, but only abnormal results are displayed) Labs Reviewed  URINALYSIS, ROUTINE W REFLEX MICROSCOPIC - Abnormal; Notable for the following components:      Result Value   APPearance CLOUDY (*)    Leukocytes,Ua SMALL (*)    All other components within normal limits  RAPID URINE DRUG SCREEN, HOSP PERFORMED - Abnormal; Notable for the following components:   Benzodiazepines POSITIVE (*)    Tetrahydrocannabinol POSITIVE (*)    All other components within normal limits  COMPREHENSIVE METABOLIC PANEL - Abnormal; Notable for the following components:   Potassium 3.3 (*)    CO2 21 (*)    Calcium 8.8 (*)    All other components within normal limits  URINALYSIS, MICROSCOPIC (REFLEX) - Abnormal; Notable for the following components:   Bacteria, UA RARE (*)    All other components within normal limits  PREGNANCY, URINE  CBC WITH DIFFERENTIAL/PLATELET  LIPASE, BLOOD  TROPONIN I  D-DIMER, QUANTITATIVE (NOT AT [facility])    EKG EKG Interpretation  Date/Time:  Friday May 17 2018 19:05:04 EST Ventricular Rate:  105 PR Interval:    QRS Duration: 88 QT Interval:  340 QTC Calculation: 450 R Axis:   37 Text Interpretation:  Sinus tachycardia Low voltage, precordial leads Borderline T abnormalities, diffuse leads No significant change since last tracing Confirmed by Richardean CanalYao, David H [phone]) on 05/17/2018 7:31:19 PM   Radiology Ct Abdomen Pelvis W Contrast  Result Date:  05/17/2018 CLINICAL DATA:  Three-week history of intermittent abdominal pain. EXAM: CT ABDOMEN AND PELVIS WITH CONTRAST TECHNIQUE: Multidetector CT imaging of the abdomen and pelvis was performed using the standard protocol following bolus administration of intravenous contrast. CONTRAST:  100mL ISOVUE-300 IOPAMIDOL (ISOVUE-300) INJECTION 61% COMPARISON:  03/26/2017 FINDINGS: Lower chest: Unremarkable. Hepatobiliary: No suspicious focal abnormality within the liver parenchyma. Gallbladder surgically absent. No intrahepatic or extrahepatic biliary dilation. Pancreas: No focal mass lesion. No dilatation of the main  duct. No intraparenchymal cyst. No peripancreatic edema. Spleen: No splenomegaly. No focal mass lesion. Adrenals/Urinary Tract: No adrenal nodule or mass. Probable tiny bilateral nonobstructing renal stones although assessment limited by early excretion of intravenous contrast material. No evidence for hydroureter. The urinary bladder appears normal for the degree of distention. Stomach/Bowel: Stomach is unremarkable. No gastric wall thickening. No evidence of outlet obstruction. Duodenum is normally positioned as is the ligament of Treitz. No small bowel wall thickening. No small bowel dilatation. The terminal ileum is normal. The appendix is normal. No gross colonic mass. No colonic wall thickening. Vascular/Lymphatic: No abdominal aortic aneurysm. No abdominal aortic atherosclerotic calcification. There is no gastrohepatic or hepatoduodenal ligament lymphadenopathy. No intraperitoneal or retroperitoneal lymphadenopathy. No pelvic sidewall lymphadenopathy. Reproductive: Probable uterine fibroids.  There is no adnexal mass. Other: No intraperitoneal free fluid. Musculoskeletal: No worrisome lytic or sclerotic osseous abnormality. IMPRESSION: 1. No acute findings in the abdomen or pelvis. Specifically, no findings to explain the patient's history of pain. 2. Probable tiny bilateral nonobstructing renal  stones although assessment limited by early excretion of intravenous contrast. Patient was noted to have tiny nonobstructing stones in both kidneys on the prior study. Electronically Signed   By: Kennith Center M.D.   On: 05/17/2018 21:32    Procedures Procedures (including critical care time)  Angiocath insertion Performed by: Richardean Canal  Consent: Verbal consent obtained. Risks and benefits: risks, benefits and alternatives were discussed Time out: Immediately prior to procedure a "time out" was called to verify the correct patient, procedure, equipment, support staff and site/side marked as required.  Preparation: Patient was prepped and draped in the usual sterile fashion.  Vein Location: R basilic  Ultrasound Guided  Gauge: 20 long   Normal blood return and flush without difficulty Patient tolerance: Patient tolerated the procedure well with no immediate complications.     Medications Ordered in ED Medications  ondansetron (ZOFRAN) injection 4 mg (4 mg Intravenous Not Given 05/17/18 2022)  cephALEXin (KEFLEX) capsule 500 mg (has no administration in time range)  potassium chloride SA (K-DUR,KLOR-CON) CR tablet 40 mEq (has no administration in time range)  sodium chloride 0.9 % bolus 1,000 mL (1,000 mLs Intravenous New Bag/Given 05/17/18 2018)  iopamidol (ISOVUE-300) 61 % injection 100 mL (100 mLs Intravenous Contrast Given 05/17/18 2102)     Initial Impression / Assessment and Plan / ED Course  I have reviewed the triage vital signs and the nursing notes.  Pertinent labs & imaging results that were available during my care of the patient were reviewed by me and considered in my medical decision making (see chart for details).       Kimberly Lynch is a 34 y.o. female here with chest pain, SOB, RUQ pain for 3 weeks. Patient tachycardic. Consider PE vs pyelo vs biliary colic vs renal colic vs pancreatitis. Patient also appears anxious. Will get labs, d-dimer, CT ab/pel,  UA.   9:47 PM HR normal now. D-dimer neg. Labs showed K 3.3. UA ? UTI. UDS positive for benzo, marijuana. CT showed no acute findings. She has small kidney stones that were there before and she has no hydro. Given keflex. I don't know why she has been having abdominal pain for 3 weeks. Will refer to GI for evaluation.    Final Clinical Impressions(s) / ED Diagnoses   Final diagnoses:  None    ED Discharge Orders    None       Charlynne Pander, MD 05/17/18 2148

## 2018-05-17 NOTE — ED Triage Notes (Signed)
Abdominal pain x 3 weeks on and off. Pain goes into her back. No injury.

## 2018-06-01 IMAGING — DX DG RIBS W/ CHEST 3+V*R*
3 series · 3 of 3 positions shown · non-contrast
Comparison: Chest x-ray dated May 10, 2016.

CLINICAL DATA: Right knee posterior rib pain the past 2-3 weeks. No
known injury.

EXAM:
RIGHT RIBS AND CHEST - 3+ VIEW

[rib ap]
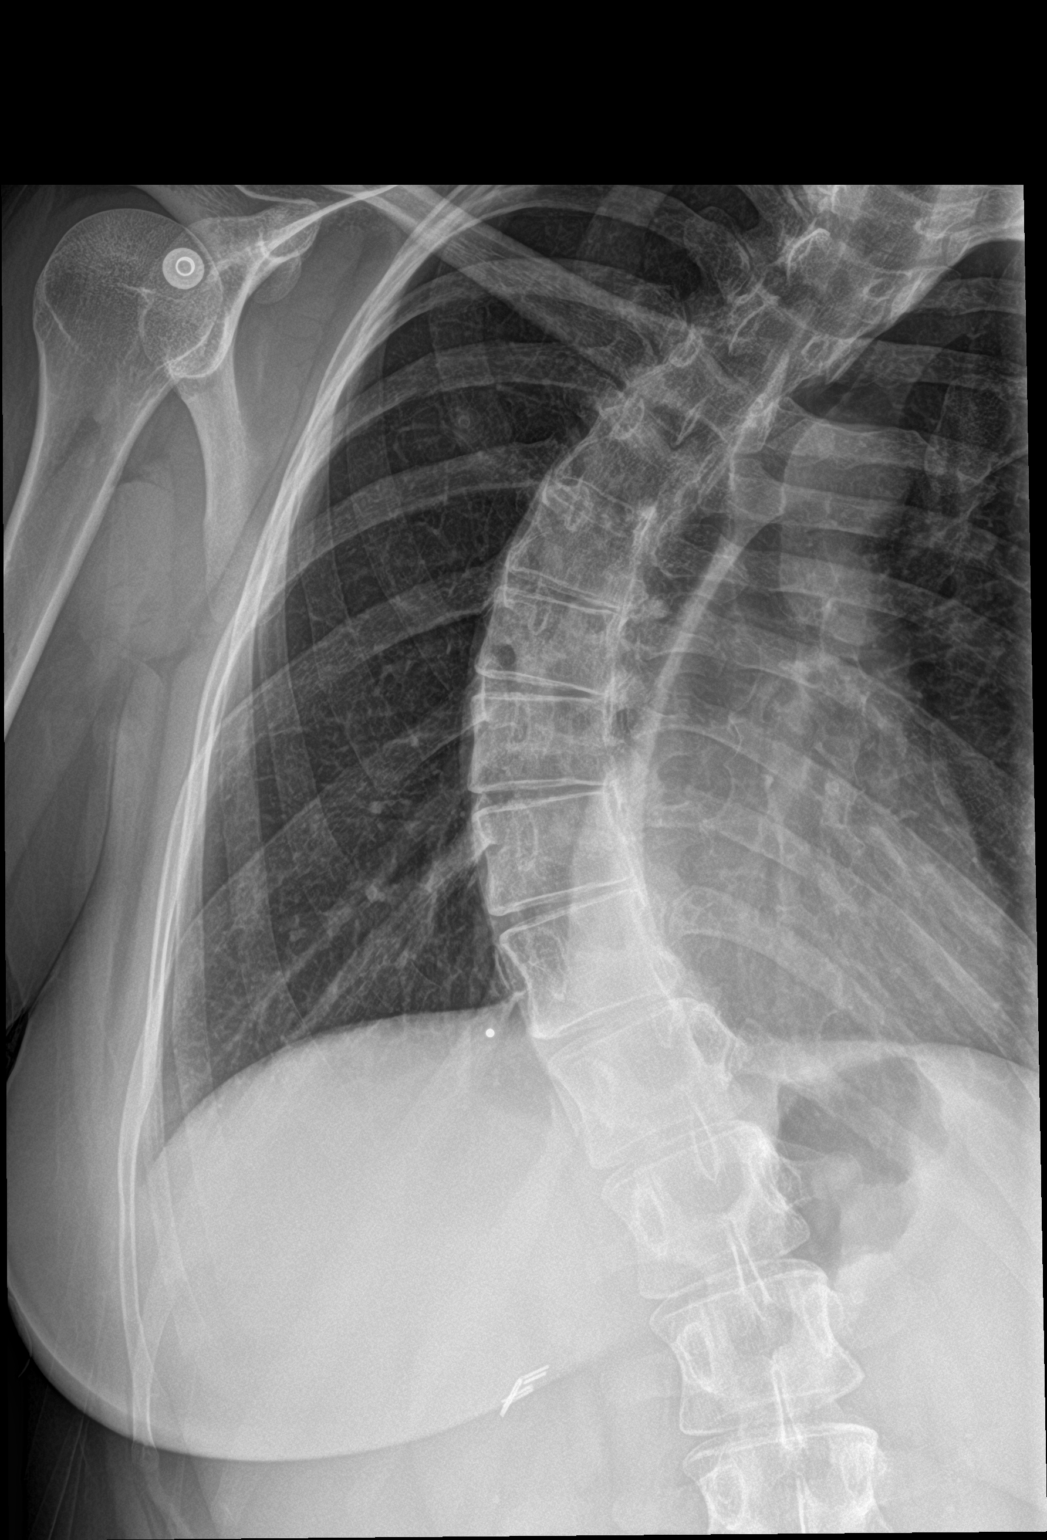

[rib ap obl]
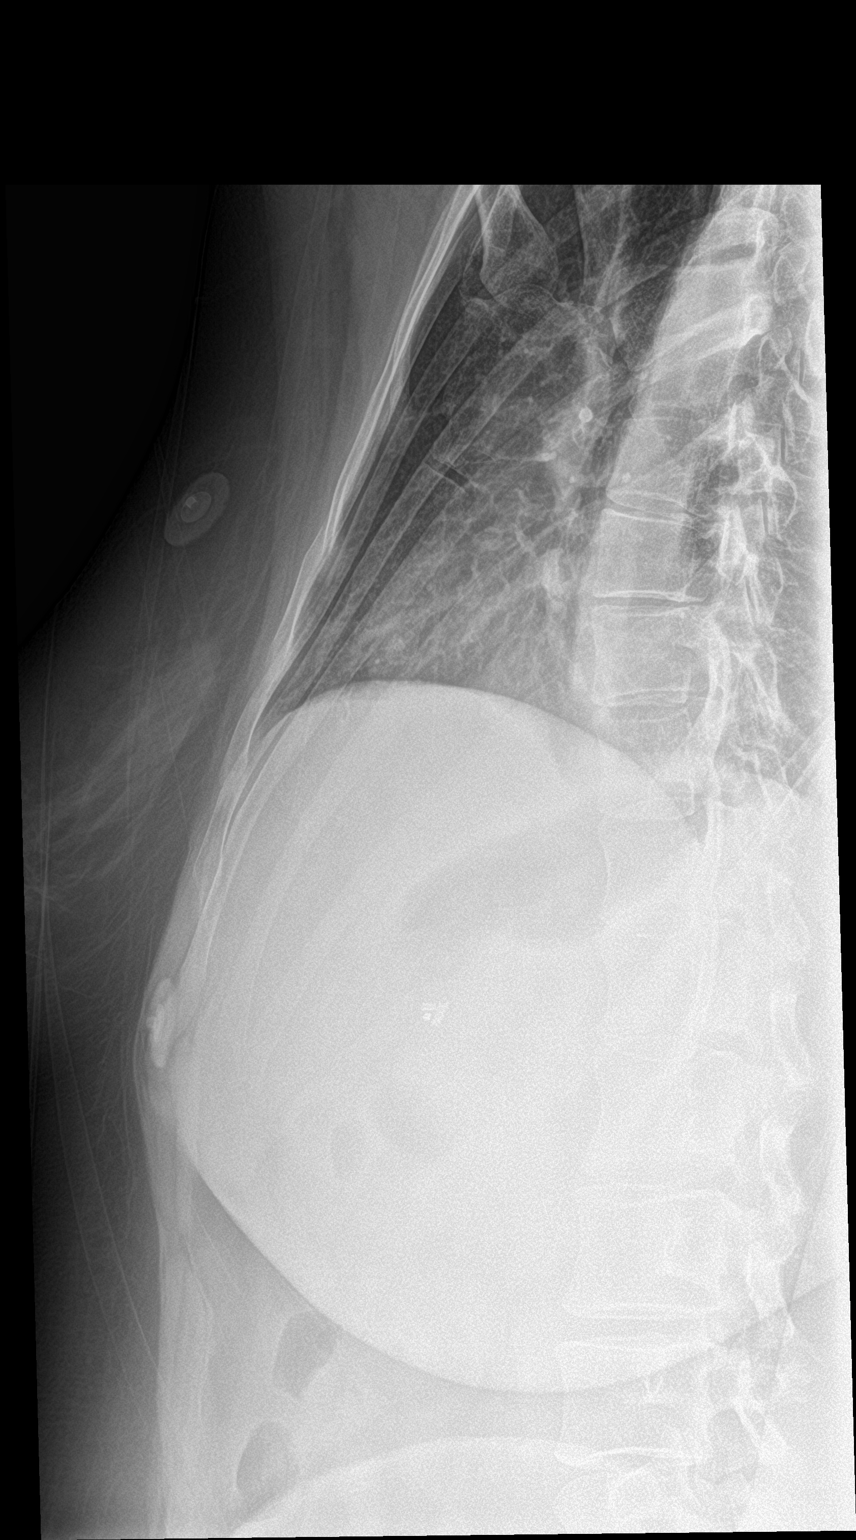

[chest pa]
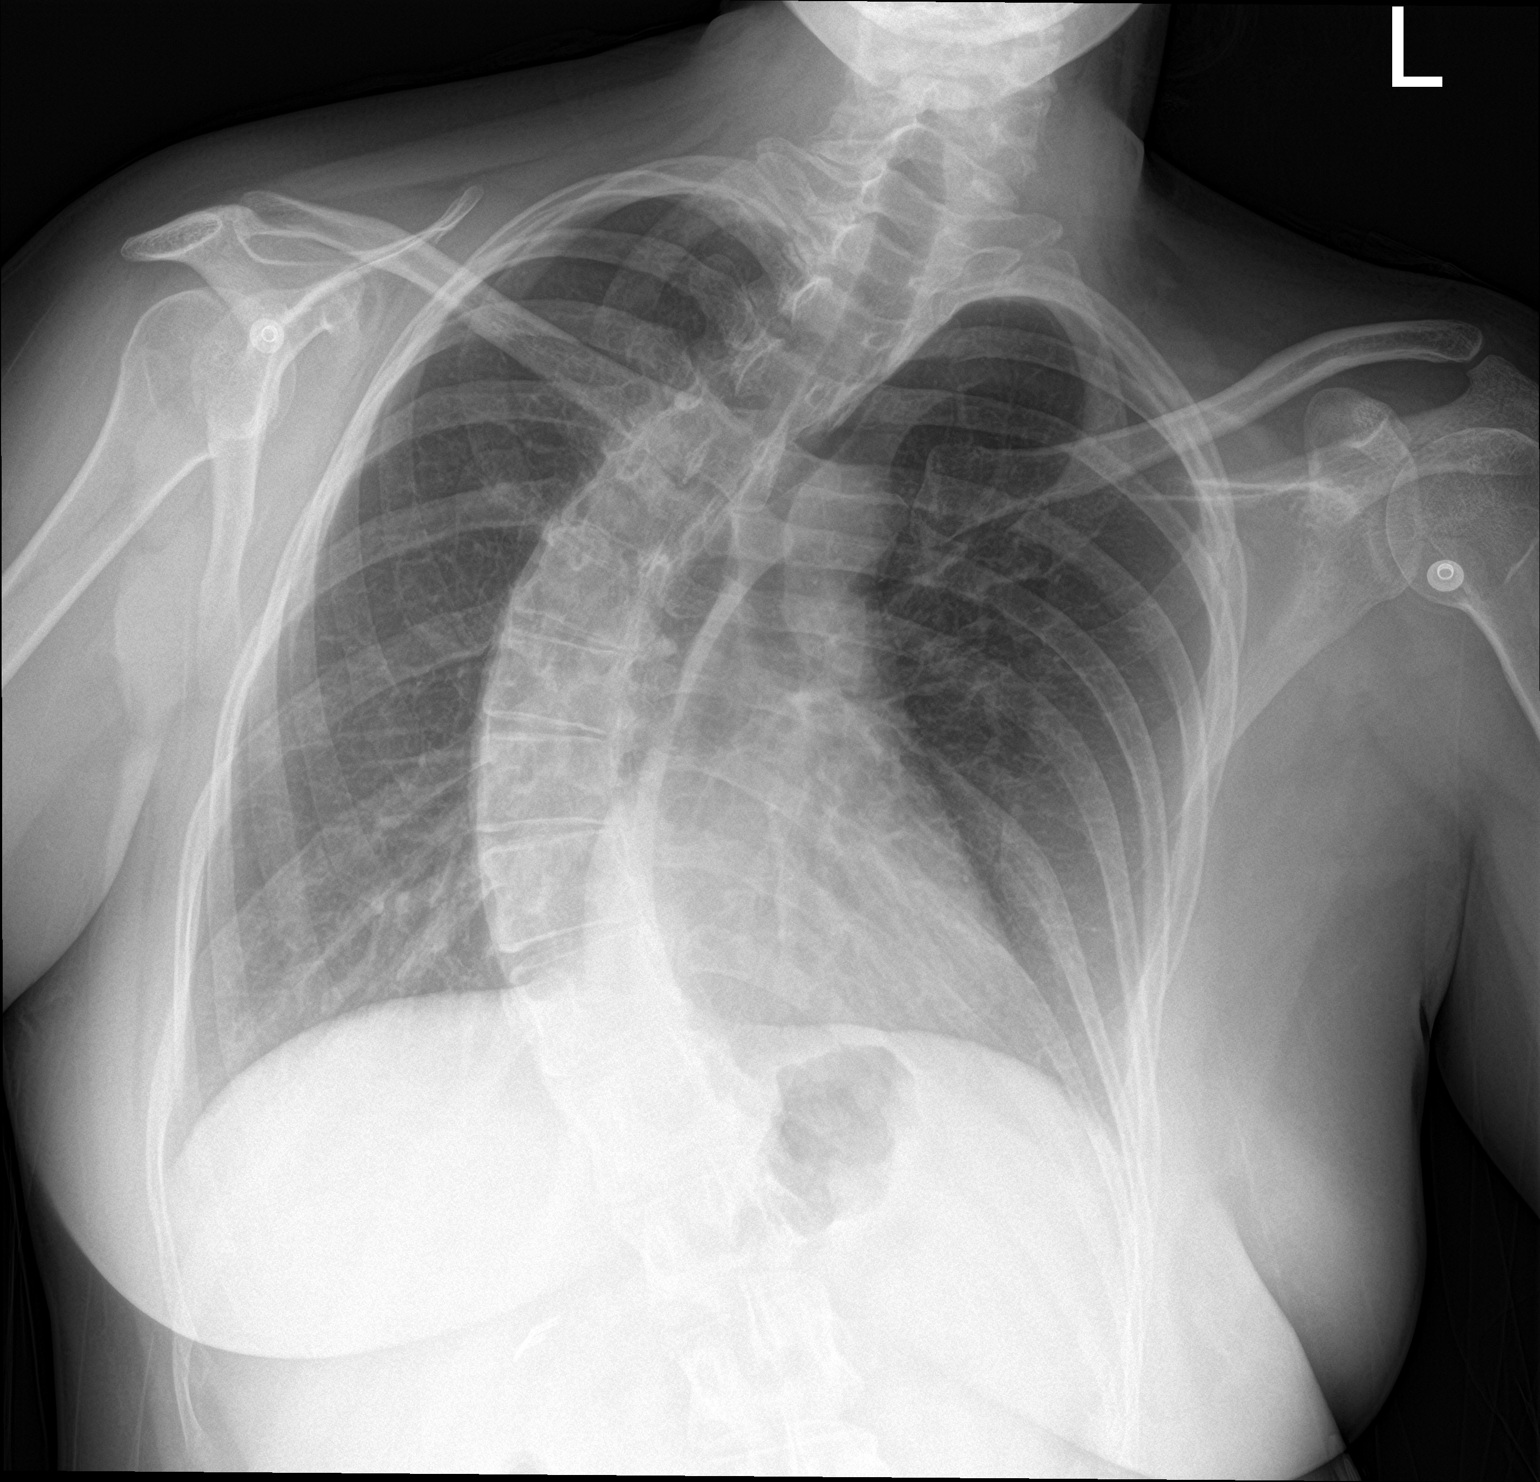

[3 of 3 positions shown; findings below may reference images not displayed]

FINDINGS: No fracture or other bone lesions are seen involving the ribs. There
is no evidence of pneumothorax or pleural effusion. Both lungs are
clear. Heart size and mediastinal contours are within normal limits.
Stable severe thoracic dextroscoliosis.
IMPRESSION: Negative.

## 2018-07-10 ENCOUNTER — Other Ambulatory Visit (HOSPITAL_COMMUNITY): Payer: Self-pay

## 2018-07-10 ENCOUNTER — Telehealth (HOSPITAL_COMMUNITY): Payer: Self-pay

## 2018-07-10 NOTE — Telephone Encounter (Signed)
We will not provide any extra Klonopin.  She should not be increasing the Klonopin without physician's permission.  She also need to schedule appointment for future refills.

## 2018-07-10 NOTE — Telephone Encounter (Signed)
Pt called to get an increase on her Klonopin 0.5 mg from 1 tablet daily to 3 tablets. She has independently increased herself to 2 tablets (1 in the morning and 1 in the afternoon). She would like the increase in her prescription. Please review and advise.

## 2018-07-12 NOTE — Telephone Encounter (Signed)
Spoke with patient and she states that Klonopin once a day is not working. I scheduled her for Tuesday afternoon so that she could speak to the doctor about her concerns.

## 2018-07-16 ENCOUNTER — Ambulatory Visit (INDEPENDENT_AMBULATORY_CARE_PROVIDER_SITE_OTHER): Payer: Medicare HMO | Admitting: Psychiatry

## 2018-07-16 ENCOUNTER — Encounter (HOSPITAL_COMMUNITY): Payer: Self-pay | Admitting: Psychiatry

## 2018-07-16 ENCOUNTER — Other Ambulatory Visit: Payer: Self-pay

## 2018-07-16 DIAGNOSIS — F319 Bipolar disorder, unspecified: Secondary | ICD-10-CM | POA: Diagnosis not present

## 2018-07-16 DIAGNOSIS — F609 Personality disorder, unspecified: Secondary | ICD-10-CM | POA: Diagnosis not present

## 2018-07-16 DIAGNOSIS — F4310 Post-traumatic stress disorder, unspecified: Secondary | ICD-10-CM

## 2018-07-16 MED ORDER — CHLORPROMAZINE HCL 25 MG PO TABS: 25.0000 mg | ORAL_TABLET | Freq: Three times a day (TID) | ORAL | 1 refills | Status: DC
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
## 2018-09-16 ENCOUNTER — Ambulatory Visit (HOSPITAL_COMMUNITY): Payer: Medicare HMO | Admitting: Psychiatry

## 2018-09-17 ENCOUNTER — Encounter (HOSPITAL_COMMUNITY): Payer: Self-pay | Admitting: Psychiatry

## 2018-09-17 ENCOUNTER — Telehealth (HOSPITAL_COMMUNITY): Payer: Self-pay | Admitting: Psychiatry

## 2018-09-17 NOTE — Telephone Encounter (Signed)
09/17/18 2:55pm Discharged letter sent certified number [id number].Marland KitchenMariana Kaufman

## 2018-12-12 ENCOUNTER — Other Ambulatory Visit: Payer: Self-pay

## 2018-12-12 ENCOUNTER — Encounter (HOSPITAL_COMMUNITY): Payer: Self-pay | Admitting: Emergency Medicine

## 2018-12-12 ENCOUNTER — Emergency Department (HOSPITAL_COMMUNITY): Payer: Medicare HMO

## 2018-12-12 ENCOUNTER — Emergency Department (HOSPITAL_COMMUNITY)
Admission: EM | Admit: 2018-12-12 | Discharge: 2018-12-12 | Disposition: A | Discharge status: Home / Self Care | Payer: Medicare HMO | Attending: Emergency Medicine | Admitting: Emergency Medicine

## 2018-12-12 DIAGNOSIS — Z79899 Other long term (current) drug therapy: Secondary | ICD-10-CM | POA: Diagnosis not present

## 2018-12-12 DIAGNOSIS — R079 Chest pain, unspecified: Secondary | ICD-10-CM | POA: Diagnosis not present

## 2018-12-12 DIAGNOSIS — J449 Chronic obstructive pulmonary disease, unspecified: Secondary | ICD-10-CM | POA: Diagnosis not present

## 2018-12-12 DIAGNOSIS — F1210 Cannabis abuse, uncomplicated: Secondary | ICD-10-CM | POA: Diagnosis not present

## 2018-12-12 DIAGNOSIS — F17210 Nicotine dependence, cigarettes, uncomplicated: Secondary | ICD-10-CM | POA: Diagnosis not present

## 2018-12-12 DIAGNOSIS — R0789 Other chest pain: Secondary | ICD-10-CM

## 2018-12-12 HISTORY — DX: Chronic obstructive pulmonary disease, unspecified: J44.9

## 2018-12-12 LAB — BASIC METABOLIC PANEL
Anion gap: 12 (ref 5–15)
BUN: 8 mg/dL (ref 6–20)
CO2: 18 mmol/L — ABNORMAL LOW (ref 22–32)
Calcium: 9.4 mg/dL (ref 8.9–10.3)
Chloride: 108 mmol/L (ref 98–111)
Creatinine, Ser: 0.54 mg/dL (ref 0.44–1.00)
GFR calc Af Amer: >60 mL/min (ref >60)
GFR calc non Af Amer: >60 mL/min (ref >60)
Glucose, Bld: 70 mg/dL (ref 70–99)
Potassium: 3.8 mmol/L (ref 3.5–5.1)
Sodium: 138 mmol/L (ref 135–145)

## 2018-12-12 LAB — TROPONIN I (HIGH SENSITIVITY)
Troponin I (High Sensitivity): <2 ng/L (ref <18)
Troponin I (High Sensitivity): <2 ng/L (ref <18)

## 2018-12-12 LAB — CBC
HCT: 50.3 % — ABNORMAL HIGH (ref 36.0–46.0)
Hemoglobin: 16 g/dL — ABNORMAL HIGH (ref 12.0–15.0)
MCH: 30.8 pg (ref 26.0–34.0)
MCHC: 31.8 g/dL (ref 30.0–36.0)
MCV: 96.7 fL (ref 80.0–100.0)
Platelets: 314 10*3/uL (ref 150–400)
RBC: 5.2 MIL/uL — ABNORMAL HIGH (ref 3.87–5.11)
RDW: 12.3 % (ref 11.5–15.5)
WBC: 11.3 10*3/uL — ABNORMAL HIGH (ref 4.0–10.5)
nRBC: 0 % (ref 0.0–0.2)

## 2018-12-12 LAB — I-STAT BETA HCG BLOOD, ED (NOT ORDERABLE): I-stat hCG, quantitative: <5 m[IU]/mL (ref <5)

## 2018-12-12 LAB — D-DIMER, QUANTITATIVE: D-Dimer, Quant: 0.36 ug/mL-FEU (ref 0.00–0.50)

## 2018-12-12 MED ORDER — DICLOFENAC SODIUM 1 % TD GEL: 2.0000 g | Freq: Four times a day (QID) | TRANSDERMAL | 0 refills | Status: DC

## 2018-12-12 MED ORDER — SODIUM CHLORIDE 0.9 % IV BOLUS
1000.0000 mL | Freq: Once | INTRAVENOUS | Status: AC
  Administered 2018-12-12: 1000 mL via INTRAVENOUS

## 2018-12-12 NOTE — ED Notes (Signed)
Lab called and stated that the Troponin needed to be recollected due to not enough blood.

## 2018-12-12 NOTE — ED Triage Notes (Signed)
Pt c/o intermittent chest pains that will be on right side and then again on left side and abd pains. Reports that she has COPD stage 3 and takes nebs when she has SOB. Reports called her lung doctor earlier and was told to go to Ed immediately.

## 2018-12-12 NOTE — ED Notes (Signed)
Main phlebotomy called to collect D-dimer.

## 2018-12-12 NOTE — Discharge Instructions (Addendum)
Continue taking your home medications as prescribed. Use Voltaren gel as needed for chest pain. Follow-up with your primary care doctor for further evaluation. Return to emergency room with any new, worsening, or concerning symptoms.

## 2018-12-13 NOTE — ED Provider Notes (Signed)
[facility]-EMERGENCY DEPT Provider Note   CSN: [id number] Arrival date & time: 12/12/18  1645     History   Chief Complaint Chief Complaint  Patient presents with  . Chest Pain    HPI Kimberly Lynch is a 33 y.o. female presenting for evaluation of chest pain.  Patient states for the past week, she has had shooting chest pain in her anterior chest.  Yesterday, her pain worsened.  Pain is mostly central into the right.  It is described as a shooting pain, worse with inspiration.  Patient is concerned this is lung pain due to COPD.  Patient states she was recently diagnosed with COPD stage III, pulmonologist currently investigating as to cause of her COPD.  She is on nebulizers every 6 hours, has been taking them as prescribed.  She denies fevers, chills, sore throat, cough, shortness of breath, nausea, vomiting, abdominal pain, urinary, abnormal bowel movements.  Patient states she was started on Prozac yesterday, 20 minutes afterward she started to have palpitations and feels sick, immediately throughout the medication.  She has not taken any since.  History of anxiety, bipolar, COPD, scoliosis.  Pt states she has been having worsening anxiety due to deteriorating relationship with her husband.     HPI  Past Medical History:  Diagnosis Date  . Anxiety   . Arthritis   . Bipolar 1 disorder (HCC)   . COPD (chronic obstructive pulmonary disease) (HCC)   . Depression   . Headache 04/20/2015  . Near syncope 02/03/2015  . Panic   . Scoliosis     Patient Active Problem List   Diagnosis Date Noted  . Insomnia 11/13/2017  . Generalized anxiety disorder 11/13/2017  . Paresthesia of both lower extremities 07/27/2016  . Scoliosis 07/19/2016  . Polyarthralgia 05/03/2016  . High risk sexual behavior 05/03/2016  . Bipolar 1 disorder (HCC) 01/17/2016  . Headache 04/20/2015    Past Surgical History:  Procedure Laterality Date  . CHOLECYSTECTOMY N/A 05/20/2015   Procedure: LAPAROSCOPIC CHOLECYSTECTOMY WITH ATTEMPTED INTRAOPERATIVE CHOLANGIOGRAM;  Surgeon: Violeta GelinasBurke Thompson, MD;  Location: [facility] OR;  Service: General;  Laterality: N/A;  . FRACTURE SURGERY    . TUBAL LIGATION       OB History   No obstetric history on file.      Home Medications    Prior to Admission medications   Medication Sig Start Date End Date Taking? Authorizing Provider  clonazePAM (KLONOPIN) 0.5 MG tablet Take 0.5 mg by mouth daily as needed for anxiety. 12/09/18  Yes [provider]  HYDROcodone-acetaminophen (NORCO/VICODIN) 5-325 MG tablet Take 1-2 tablets by mouth 3 (three) times daily as needed for pain. 11/28/18  Yes [provider]  ibuprofen (ADVIL) 200 MG tablet Take 600 mg by mouth every 6 (six) hours as needed for moderate pain.   Yes [provider]  chlorproMAZINE (THORAZINE) 25 MG tablet Take 1 tablet (25 mg total) by mouth 3 (three) times daily. Patient not taking: Reported on 12/12/2018 07/16/18   Arfeen, Phillips GroutSyed T, MD  diclofenac sodium (VOLTAREN) 1 % GEL Apply 2 g topically 4 (four) times daily. 12/12/18   Jonnette Nuon, PA-C  sucralfate (CARAFATE) 1 g tablet Take 1 tablet (1 g total) by mouth 4 (four) times daily -  with meals and at bedtime. Patient not taking: Reported on 12/12/2018 05/17/18   Charlynne PanderYao, David Hsienta, MD    Family History Family History  Adopted: Yes  Problem Relation Age of Onset  . Cancer Other   .  Diabetes Other     Social History Social History   Tobacco Use  . Smoking status: Current Every Day Smoker    Packs/day: 0.40    Years: 22.00    Pack years: 8.80    Types: Cigarettes  . Smokeless tobacco: Never Used  . Tobacco comment: States has cut back and working to quit  Substance Use Topics  . Alcohol use: No    Alcohol/week: 0.0 standard drinks    Comment: States has quit completely  . Drug use: Yes    Types: Marijuana    Comment: States used marijuana      Allergies   Bupropion, Other, Risperidone  and related, Vistaril [hydroxyzine], and Chlorhexidine   Review of Systems Review of Systems  Cardiovascular: Positive for chest pain.  Psychiatric/Behavioral: The patient is nervous/anxious.      Physical Exam Updated Vital Signs BP 103/74   Pulse (!) 55   Temp 98.4 F (36.9 C) (Oral)   Resp 13   SpO2 100%   Physical Exam Vitals signs and nursing note reviewed.  Constitutional:      General: She is not in acute distress.    Appearance: She is well-developed.     Comments: Pt appears anxious, nontoxic in appearance.   HENT:     Head: Normocephalic and atraumatic.  Eyes:     Conjunctiva/sclera: Conjunctivae normal.     Pupils: Pupils are equal, round, and reactive to light.  Neck:     Musculoskeletal: Normal range of motion and neck supple.  Cardiovascular:     Rate and Rhythm: Normal rate and regular rhythm.     Pulses: Normal pulses.  Pulmonary:     Effort: Pulmonary effort is normal. No respiratory distress.     Breath sounds: Normal breath sounds. No wheezing.     Comments: ttp of anterior chest wall.  Speaking in full sentences.  Clear lung sounds in all fields.  No wheezing. Chest:     Chest wall: Tenderness present.    Abdominal:     General: Bowel sounds are normal. There is no distension.     Palpations: Abdomen is soft.     Tenderness: There is no abdominal tenderness.  Musculoskeletal: Normal range of motion.     Comments: No leg pain or swelling  Skin:    General: Skin is warm and dry.  Neurological:     Mental Status: She is alert and oriented to person, place, and time.      ED Treatments / Results  Labs (all labs ordered are listed, but only abnormal results are displayed) Labs Reviewed  BASIC METABOLIC PANEL - Abnormal; Notable for the following components:      Result Value   CO2 18 (*)    All other components within normal limits  CBC - Abnormal; Notable for the following components:   WBC 11.3 (*)    RBC 5.20 (*)    Hemoglobin 16.0  (*)    HCT 50.3 (*)    All other components within normal limits  D-DIMER, QUANTITATIVE (NOT AT [facility])  I-STAT BETA HCG BLOOD, ED ([facility], [facility], [facility] ONLY)  I-STAT BETA HCG BLOOD, ED (NOT ORDERABLE)  TROPONIN I (HIGH SENSITIVITY)  TROPONIN I (HIGH SENSITIVITY)    EKG EKG Interpretation  Date/Time:  Thursday December 12 2018 17:00:20 EDT Ventricular Rate:  99 PR Interval:    QRS Duration: 93 QT Interval:  356 QTC Calculation: 457 R Axis:   46 Text Interpretation:  Sinus rhythm RSR' in  V1 or V2, right VCD or RVH Borderline T abnormalities, anterior leads Baseline wander in lead(s) I V3 no change from previous Confirmed by Arby Barrette [phone]) on 12/12/2018 9:56:04 PM   Radiology Dg Chest 2 View  Result Date: 12/12/2018 CLINICAL DATA:  Chest pain EXAM: CHEST - 2 VIEW COMPARISON:  05/17/2018, 10/23/2018 FINDINGS: The heart size and mediastinal contours are within normal limits. Both lungs are clear. Scoliosis of the spine. Clips in the right upper quadrant. IMPRESSION: No active cardiopulmonary disease. Electronically Signed   By: Jasmine Pang M.D.   On: 12/12/2018 18:58    Procedures Procedures (including critical care time)  Medications Ordered in ED Medications  sodium chloride 0.9 % bolus 1,000 mL (0 mLs Intravenous Stopped 12/12/18 2330)     Initial Impression / Assessment and Plan / ED Course  I have reviewed the triage vital signs and the nursing notes.  Pertinent labs & imaging results that were available during my care of the patient were reviewed by me and considered in my medical decision making (see chart for details).        Patient presenting for evaluation of chest pain.  Physical exam shows patient who appears nontoxic.  Patient does appear very anxious, consider anxiety as component of chest pain.  Low suspicion for ACS, as pain is mostly central to the right.  Consider PE due to increased pain with inspiration, although patient without risk factors at this time.   Will obtain labs, x-ray, and reassess.  Labs reassuring, troponin negative.  Dimer negative.  Electrolytes stable.  X-ray viewed interpreted by me, no pneumonia, pneumothorax, effusion, cardiomegaly.  EKG unchanged from previous.  Discussed findings with patient.  Discussed possibility of anxiety as cause for worsening chest pain.  Encourage follow-up with primary care.  At this time, patient appears safe for discharge.  Return precautions given.  Patient states she understands and agrees to plan.   Final Clinical Impressions(s) / ED Diagnoses   Final diagnoses:  Atypical chest pain    ED Discharge Orders         Ordered    diclofenac sodium (VOLTAREN) 1 % GEL  4 times daily     12/12/18 2301           Alveria Apley, PA-C 12/13/18 0142    Arby Barrette, MD 12/16/18 1538

## 2019-01-16 ENCOUNTER — Encounter: Payer: Self-pay | Admitting: Pulmonary Disease

## 2019-01-16 ENCOUNTER — Ambulatory Visit (INDEPENDENT_AMBULATORY_CARE_PROVIDER_SITE_OTHER): Payer: Medicare HMO | Admitting: Pulmonary Disease

## 2019-01-16 ENCOUNTER — Other Ambulatory Visit: Payer: Self-pay

## 2019-01-16 VITALS — BP 114/60 | HR 88 | Ht 64.0 in | Wt 129.4 lb

## 2019-01-16 DIAGNOSIS — J438 Other emphysema: Secondary | ICD-10-CM | POA: Diagnosis not present

## 2019-01-16 LAB — CBC WITH DIFFERENTIAL/PLATELET
Basophils Absolute: 0 10*3/uL (ref 0.0–0.1)
Basophils Relative: 0.6 % (ref 0.0–3.0)
Eosinophils Absolute: 0.1 10*3/uL (ref 0.0–0.7)
Eosinophils Relative: 1.4 % (ref 0.0–5.0)
HCT: 42.7 % (ref 36.0–46.0)
Hemoglobin: 14.1 g/dL (ref 12.0–15.0)
Lymphocytes Relative: 23.4 % (ref 12.0–46.0)
Lymphs Abs: 2 10*3/uL (ref 0.7–4.0)
MCHC: 33.1 g/dL (ref 30.0–36.0)
MCV: 91.9 fl (ref 78.0–100.0)
Monocytes Absolute: 0.4 10*3/uL (ref 0.1–1.0)
Monocytes Relative: 4.6 % (ref 3.0–12.0)
Neutro Abs: 6 10*3/uL (ref 1.4–7.7)
Neutrophils Relative %: 70 % (ref 43.0–77.0)
Platelets: 351 10*3/uL (ref 150.0–400.0)
RBC: 4.65 Mil/uL (ref 3.87–5.11)
RDW: 12.8 % (ref 11.5–15.5)
WBC: 8.5 10*3/uL (ref 4.0–10.5)

## 2019-01-16 MED ORDER — SPIRIVA RESPIMAT 2.5 MCG/ACT IN AERS: 2.0000 | INHALATION_SPRAY | Freq: Every day | RESPIRATORY_TRACT | 0 refills | Status: DC

## 2019-01-16 NOTE — Progress Notes (Signed)
Subjective:    Patient ID: Kimberly Lynch, female    DOB: 10-30-1985, 33 y.o.   MRN: [id number]  Patient was recently told about having COPD Came in for another opinion  Was recently evaluated for shortness of shortness of breath, cough, decreased secretions, chest discomfort/chest pain  Longstanding history of smoking-over 20 years smoking history Smoked about 1-1/2 packs a day at the heaviest Currently smokes about 4 to 6 cigarettes a day she states Has tried to quit multiple occasions  Use of illicit substances in the past including cocaine, marijuana  Denies shortness of breath with moderate activity Does occasionally get short of breath at rest He has a cough, coughing up clear secretions He has what she calls lung pain She does have some joint aches and pains Chronic back pain  Scoliosis of the spine She is exposed to a lot of secondhand smoke from family and other members of her household  No family history of lung disease known to her-she is adopted    Past Medical History:  Diagnosis Date  . Anxiety   . Arthritis   . Bipolar 1 disorder (HCC)   . COPD (chronic obstructive pulmonary disease) (HCC)   . Depression   . Headache 04/20/2015  . Near syncope 02/03/2015  . Panic   . Scoliosis    Social History   Socioeconomic History  . Marital status: Married    Spouse name: Not on file  . Number of children: 3  . Years of education: 9th  . Highest education level: Not on file  Occupational History  . 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 Financial resource strain: Not on file  . Food insecurity    Worry: Not on file    Inability: Not on file  . Transportation needs    Medical: Not on file    Non-medical: Not on file  Tobacco Use  . Smoking status: Current Every Day Smoker    Packs/day: 0.25    Years: 22.00    Pack years: 5.50    Types: Cigarettes  . Smokeless tobacco: Never Used  . Tobacco comment: States has cut back and working to  quit  Substance and Sexual Activity  . Alcohol use: No    Alcohol/week: 0.0 standard drinks    Comment: States has quit completely  . Drug use: Yes    Types: Marijuana    Comment: States used marijuana   . Sexual activity: Yes    Partners: Male    Birth control/protection: None  Lifestyle  . Physical activity    Days per week: Not on file    Minutes per session: Not on file  . Stress: Not on file  Relationships  . Social Musician on phone: Not on file    Gets together: Not on file    Attends religious service: Not on file    Active member of club or organization: Not on file    Attends meetings of clubs or organizations: Not on file    Relationship status: Not on file  . Intimate partner violence    Fear of current or ex partner: Not on file    Emotionally abused: Not on file    Physically abused: Not on file    Forced sexual activity: Not on file  Other Topics Concern  . Not on file  Social History Narrative   Patient lives at home with family.   Caffeine Use: 3 16oz  sodas daily   Family History  Adopted: Yes  Problem Relation Age of Onset  . Cancer Other   . Diabetes Other    Review of Systems  Constitutional: Negative for fever and unexpected weight change.  HENT: Positive for sinus pressure. Negative for congestion, dental problem, ear pain, nosebleeds, postnasal drip, rhinorrhea, sneezing, sore throat and trouble swallowing.   Eyes: Negative for redness and itching.  Respiratory: Positive for cough, chest tightness and shortness of breath. Negative for wheezing.   Cardiovascular: Negative for palpitations and leg swelling.  Gastrointestinal: Negative for nausea and vomiting.  Genitourinary: Negative for dysuria.  Musculoskeletal: Negative for joint swelling.  Skin: Negative for rash.  Allergic/Immunologic: Positive for environmental allergies and food allergies. Negative for immunocompromised state.  Neurological: Negative for headaches.   Hematological: Bruises/bleeds easily.  Psychiatric/Behavioral: Negative for dysphoric mood. The patient is nervous/anxious.       Objective:   Physical Exam HENT:     Head: Normocephalic.     Nose: Nose normal. No congestion.     Mouth/Throat:     Mouth: Mucous membranes are moist.     Comments: Dental caries Eyes:     General:        Right eye: No discharge.        Left eye: No discharge.     Pupils: Pupils are equal, round, and reactive to light.  Neck:     Musculoskeletal: Normal range of motion. No neck rigidity.  Cardiovascular:     Rate and Rhythm: Normal rate and regular rhythm.     Pulses: Normal pulses.     Heart sounds: No murmur.  Pulmonary:     Effort: Pulmonary effort is normal. No respiratory distress.     Breath sounds: Normal breath sounds. No stridor. No wheezing, rhonchi or rales.  Abdominal:     General: Abdomen is flat.  Musculoskeletal: Normal range of motion.        General: No swelling.  Skin:    General: Skin is warm.     Coloration: Skin is not jaundiced or pale.  Neurological:     Mental Status: She is alert.     Cranial Nerves: No cranial nerve deficit.  Psychiatric:     Comments: Anxious    Vitals:   01/16/19 1448  BP: 114/60  Pulse: 88  SpO2: 98%      Assessment & Plan:  .  Chronic obstructive pulmonary disease -Recently diagnosed with COPD -Chest x-ray does reveal hyperinflated lung fields -History of significant smoking and use of illicit substances in the past may predispose to early COPD -Need to rule out alpha-1 antitrypsin deficiency  .  Restrictive lung disease. -He does have significant scoliosis -Consult hyperinflated on x-ray  .  Scoliosis  .  Shortness of breath -Likely related to obstructive lung disease  .  Active smoker -Importance of quitting smoking discussed with patient -The risk of progression of disease discussed with patient  .  Bipolar disorder   Plan:  Obtain alpha-1 antitrypsin level  CT  scan of the chest to further evaluate for emphysema  Obtain ANA and rheumatoid factor-pleuritic pain/chest discomfort  CBC with differentials  Continue Spiriva  Continue albuterol use via nebulizer

## 2019-01-16 NOTE — Patient Instructions (Signed)
History of COPD Active smoker Lung pain/chest pain  We will get some blood work -Genetic predisposition for COPD -Alpha-1 antitrypsin -Markers of inflammation    You need to quit smoking -The expectation is she will lose a lot more lung function if you continue to smoke -The medications will not prevent you from losing lung function, the only thing that can help preserve lung function is to quit smoking  We will repeat your breathing study-this should be done when you are feeling your best We will obtain a CT scan of the chest to assess for emphysema  We will see you back in the office in 4 weeks  Call with significant concerns  Continue inhalers and nebulization use

## 2019-01-20 ENCOUNTER — Telehealth: Payer: Self-pay | Admitting: Pulmonary Disease

## 2019-01-20 NOTE — Telephone Encounter (Signed)
Pt returning call. She states it could be about test results

## 2019-01-20 NOTE — Telephone Encounter (Signed)
Were you guys trying to reach the pt?

## 2019-01-20 NOTE — Telephone Encounter (Signed)
Pt was seen by Dr. Ander Slade on 10/22 and labs were ordered, but I do not see results in pt's chart on these labs.  No notes in [employer], who is responsible for Dr. Judson Roch messages.    ATC pt, line rang X3 and was disconnected.    A PFT and CT chest were also ordered.  PCCs please advise if you have been trying to contact patient about these tests.  Thanks!

## 2019-01-21 NOTE — Telephone Encounter (Signed)
Patient returning call and requesting results from 10.22.2020 labs.  Dr Ander Slade please advise, thank you.

## 2019-01-22 ENCOUNTER — Encounter: Payer: Self-pay | Admitting: Pulmonary Disease

## 2019-01-22 NOTE — Telephone Encounter (Signed)
Patient is returning phone call.  Patient phone number is [phone].

## 2019-01-22 NOTE — Telephone Encounter (Signed)
Dr Ander Slade, please advise on labs, thanks

## 2019-01-23 LAB — ANA: Anti Nuclear Antibody (ANA): NEGATIVE

## 2019-01-23 LAB — ALPHA-1 ANTITRYPSIN PHENOTYPE: A-1 Antitrypsin, Ser: 180 mg/dL (ref 83–199)

## 2019-01-23 LAB — RHEUMATOID FACTOR: Rheumatoid fact SerPl-aCnc: <14 IU/mL (ref <14)

## 2019-01-23 NOTE — Telephone Encounter (Signed)
Pt called for lab results. Please advise.

## 2019-01-23 NOTE — Telephone Encounter (Signed)
Called and spoke to pt. Infomred her the results are not yet back. Pt verbalized understanding and requested for [place] to leave a detailed VM of the results.

## 2019-01-24 NOTE — Telephone Encounter (Signed)
Patient called office, states she has been calling for 8 days regarding lab work, I made her aware I do not see any documentation where she has been calling for 8 days and her appt was only 7 days ago. Made aware of AO results:  Notes recorded by Laurin Coder, MD on 01/24/2019 at 11:56 AM EDT  All requested testing results normal   Alpha antitrypsin level was within normal range  CBC was within normal range  ANA was negative  Rheumatoid factor negative  Voiced understanding. She is asking to go ahead and schedule her CT. I made her aware I would send a message to our [facility] team. I made her aware they would get it scheduled and contact her and if that date did not work they would give her the phone number to r/s/ Voiced understanding.   Aware AO request CT in 2-4 weeks/   Will route to [facility] pool.

## 2019-01-24 NOTE — Telephone Encounter (Signed)
I just called the patient about her Ct appt at [facility] and she stated that no one had tried to contact her about the appt see below She now states that will not work for her and I gave her the # [phone] to call [facility] to reschedule if she needs to  [facility] for 11/2 @ 3, check in @ 2:30, no prep.  Attempted to reach pt Xs 3 & was hung up on each time.  Mailed appt info to pt.  Faxed records to [place], rec'd fax confirm.  Rec'd Pa from [place].

## 2019-01-24 NOTE — Telephone Encounter (Signed)
Pt is calling regarding lab results CB# [phone]

## 2019-02-03 ENCOUNTER — Telehealth: Payer: Self-pay | Admitting: Pulmonary Disease

## 2019-02-03 NOTE — Telephone Encounter (Signed)
Call patient  CT scan reviewed No significant changes of emphysema Small 2 mm nodules-not clinically significant nodules  No changes need to be made based off of current CT Continue routine follow-up

## 2019-02-04 NOTE — Telephone Encounter (Signed)
ATC patient x's 2.  Phone rings and then busy signal. Will try again at a later time.

## 2019-02-05 NOTE — Telephone Encounter (Signed)
ATC Patient.  Phone rings then goes busy. Will try again at a later time.

## 2019-02-06 ENCOUNTER — Encounter: Payer: Self-pay | Admitting: *Deleted

## 2019-02-06 NOTE — Telephone Encounter (Signed)
ATC Patient.  Phone rings then goes busy. Letter mailed to Patient requesting her contact office for CT results.

## 2019-02-18 ENCOUNTER — Encounter: Payer: Self-pay | Admitting: Pulmonary Disease

## 2019-02-18 ENCOUNTER — Other Ambulatory Visit: Payer: Self-pay

## 2019-02-18 ENCOUNTER — Ambulatory Visit (INDEPENDENT_AMBULATORY_CARE_PROVIDER_SITE_OTHER): Payer: Medicare HMO | Admitting: Pulmonary Disease

## 2019-02-18 VITALS — BP 118/60 | HR 100 | Temp 98.1°F | Ht 64.0 in | Wt 125.2 lb

## 2019-02-18 DIAGNOSIS — R05 Cough: Secondary | ICD-10-CM

## 2019-02-18 DIAGNOSIS — R0602 Shortness of breath: Secondary | ICD-10-CM

## 2019-02-18 DIAGNOSIS — R059 Cough, unspecified: Secondary | ICD-10-CM

## 2019-02-18 MED ORDER — SPIRIVA RESPIMAT 2.5 MCG/ACT IN AERS: 2.0000 | INHALATION_SPRAY | Freq: Every day | RESPIRATORY_TRACT | 0 refills | Status: DC

## 2019-02-18 NOTE — Progress Notes (Signed)
Subjective:    Patient ID: Kimberly Lynch, female    DOB: 1985-04-16, 33 y.o.   MRN: [id number]  Patient was recently told about having COPD Follow-up for obstructive lung disease  Was recently evaluated for shortness of shortness of breath, cough, decreased secretions, chest discomfort/chest pain  She is cutting down on smoking but still actively smoking, 4 to 6 cigarettes a day, was smoking 1 to 1-1/2 packs a day  Has tried to quit multiple times  Stop smoking weed  Use of illicit substances in the past including cocaine, marijuana  Denies shortness of breath with moderate activity Does occasionally get short of breath at rest Still has a cough, occasional yellow secretions but usually clear Continues to have what she describes as lung pains She does have some joint aches and pains Chronic back pain  Scoliosis of the spine She is exposed to a lot of secondhand smoke from family and other members of her household  No family history of lung disease known to her-she is adopted    Past Medical History:  Diagnosis Date  . Anxiety   . Arthritis   . Bipolar 1 disorder ([place])   . COPD (chronic obstructive pulmonary disease) ([place])   . Depression   . Headache 04/20/2015  . Near syncope 02/03/2015  . Panic   . Scoliosis    Social History   Socioeconomic History  . Marital status: Married    Spouse name: Not on file  . Number of children: 3  . Years of education: 9th  . Highest education level: Not on file  Occupational History  . 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 Financial resource strain: Not on file  . Food insecurity    Worry: Not on file    Inability: Not on file  . Transportation needs    Medical: Not on file    Non-medical: Not on file  Tobacco Use  . Smoking status: Current Every Day Smoker    Packs/day: 0.25    Years: 22.00    Pack years: 5.50    Types: Cigarettes  . Smokeless tobacco: Never Used  Substance and Sexual Activity  .  Alcohol use: No    Alcohol/week: 0.0 standard drinks    Comment: States has quit completely  . Drug use: Yes    Types: Marijuana    Comment: States used marijuana   . Sexual activity: Yes    Partners: Male    Birth control/protection: None  Lifestyle  . Physical activity    Days per week: Not on file    Minutes per session: Not on file  . Stress: Not on file  Relationships  . Social Herbalist on phone: Not on file    Gets together: Not on file    Attends religious service: Not on file    Active member of club or organization: Not on file    Attends meetings of clubs or organizations: Not on file    Relationship status: Not on file  . Intimate partner violence    Fear of current or ex partner: Not on file    Emotionally abused: Not on file    Physically abused: Not on file    Forced sexual activity: Not on file  Other Topics Concern  . Not on file  Social History Narrative   Patient lives at home with family.   Caffeine Use: 3 16oz sodas daily   Family History  Adopted: Yes  Problem Relation Age of Onset  . Cancer Other   . Diabetes Other    Review of Systems  Constitutional: Negative for fever and unexpected weight change.  HENT: Positive for sinus pressure. Negative for congestion, dental problem, ear pain, nosebleeds, postnasal drip, rhinorrhea, sneezing, sore throat and trouble swallowing.   Eyes: Negative for redness and itching.  Respiratory: Positive for cough, chest tightness and shortness of breath. Negative for wheezing.   Cardiovascular: Negative for palpitations and leg swelling.  Gastrointestinal: Negative for nausea and vomiting.  Genitourinary: Negative for dysuria.  Musculoskeletal: Negative for joint swelling.  Skin: Negative for rash.  Allergic/Immunologic: Positive for environmental allergies and food allergies. Negative for immunocompromised state.  Neurological: Negative for headaches.  Hematological: Bruises/bleeds easily.   Psychiatric/Behavioral: Negative for dysphoric mood. The patient is nervous/anxious.       Objective:   Physical Exam HENT:     Head: Normocephalic.     Nose: Nose normal. No congestion.     Mouth/Throat:     Mouth: Mucous membranes are moist.     Comments: Dental caries Eyes:     General:        Right eye: No discharge.        Left eye: No discharge.     Pupils: Pupils are equal, round, and reactive to light.  Neck:     Musculoskeletal: Normal range of motion. No neck rigidity.  Cardiovascular:     Rate and Rhythm: Normal rate and regular rhythm.     Pulses: Normal pulses.     Heart sounds: No murmur.  Pulmonary:     Effort: Pulmonary effort is normal. No respiratory distress.     Breath sounds: Normal breath sounds. No stridor. No wheezing, rhonchi or rales.  Abdominal:     General: Abdomen is flat.  Neurological:     Mental Status: She is alert.  Psychiatric:     Comments: Anxious    There were no vitals filed for this visit.    Assessment & Plan:  Chronic obstructive pulmonary disease -Continues to smoke actively -Encouraged to quit smoking -Encouraged to stay active and exercise regularly  Restrictive lung disease -She does have severe scoliosis  Shortness of breath -Related to obstructive lung disease  .  Active smoker -Importance of quitting smoking discussed with patient -The risk of progression of disease discussed with patient  .  Bipolar disorder   Testing have been negative so far, CT scan significant for small nodules No evidence of emphysema Plan:  Continue Spiriva  Albuterol as needed  Follow-up with PFT   We will see her back in 3 months

## 2019-02-18 NOTE — Addendum Note (Signed)
Addended by: Hildred Alamin I on: 02/18/2019 04:43 PM   Modules accepted: Orders

## 2019-02-18 NOTE — Patient Instructions (Signed)
Continue with efforts at quitting smoking Regular exercises  Nebulizer treatment as needed  We will see you in 3 months  We will call you following your breathing study

## 2019-03-17 ENCOUNTER — Telehealth: Payer: Self-pay | Admitting: Pulmonary Disease

## 2019-03-17 NOTE — Telephone Encounter (Signed)
I spoke to pt & rescheduled covid test.  Nothing further needed.

## 2019-03-22 ENCOUNTER — Other Ambulatory Visit (HOSPITAL_COMMUNITY): Payer: Medicare HMO

## 2019-03-22 ENCOUNTER — Other Ambulatory Visit (HOSPITAL_COMMUNITY)
Admission: RE | Admit: 2019-03-22 | Discharge: 2019-03-22 | Disposition: A | Discharge status: Home / Self Care | Payer: Medicare HMO | Source: Ambulatory Visit | Attending: Pulmonary Disease | Admitting: Pulmonary Disease

## 2019-03-22 DIAGNOSIS — Z20828 Contact with and (suspected) exposure to other viral communicable diseases: Secondary | ICD-10-CM | POA: Diagnosis not present

## 2019-03-22 DIAGNOSIS — Z01812 Encounter for preprocedural laboratory examination: Secondary | ICD-10-CM | POA: Insufficient documentation

## 2019-03-22 LAB — SARS CORONAVIRUS 2 (TAT 6-24 HRS): SARS Coronavirus 2: NEGATIVE

## 2019-03-24 ENCOUNTER — Ambulatory Visit (INDEPENDENT_AMBULATORY_CARE_PROVIDER_SITE_OTHER): Payer: Medicare HMO | Admitting: Pulmonary Disease

## 2019-03-24 ENCOUNTER — Other Ambulatory Visit: Payer: Self-pay

## 2019-03-24 ENCOUNTER — Encounter: Payer: Self-pay | Admitting: Pulmonary Disease

## 2019-03-24 VITALS — BP 108/60 | HR 106 | Temp 98.4°F | Ht 64.0 in | Wt 123.0 lb

## 2019-03-24 DIAGNOSIS — J438 Other emphysema: Secondary | ICD-10-CM | POA: Diagnosis not present

## 2019-03-24 DIAGNOSIS — R0602 Shortness of breath: Secondary | ICD-10-CM | POA: Diagnosis not present

## 2019-03-24 LAB — PULMONARY FUNCTION TEST
DL/VA % pred: 142 %
DL/VA: 6.46 ml/min/mmHg/L
DLCO unc % pred: 110 %
DLCO unc: 24.72 ml/min/mmHg
FEF 25-75 Post: 0.66 L/sec
FEF 25-75 Pre: 2 L/sec
FEF2575-%Change-Post: -67 %
FEF2575-%Pred-Post: 19 %
FEF2575-%Pred-Pre: 59 %
FEV1-%Change-Post: -50 %
FEV1-%Pred-Post: 36 %
FEV1-%Pred-Pre: 73 %
FEV1-Post: 1.14 L
FEV1-Pre: 2.31 L
FEV1FVC-%Change-Post: -52 %
FEV1FVC-%Pred-Pre: 91 %
FEV6-%Change-Post: 3 %
FEV6-%Pred-Post: 83 %
FEV6-%Pred-Pre: 81 %
FEV6-Post: 3.11 L
FEV6-Pre: 3.01 L
FEV6FVC-%Pred-Post: 101 %
FEV6FVC-%Pred-Pre: 101 %
FVC-%Change-Post: 3 %
FVC-%Pred-Post: 82 %
FVC-%Pred-Pre: 80 %
FVC-Post: 3.11 L
FVC-Pre: 3.01 L
Post FEV1/FVC ratio: 37 %
Post FEV6/FVC ratio: 100 %
Pre FEV1/FVC ratio: 77 %
Pre FEV6/FVC Ratio: 100 %
RV % pred: 99 %
RV: 1.44 L
TLC % pred: 82 %
TLC: 4.16 L

## 2019-03-24 MED ORDER — BREO ELLIPTA 200-25 MCG/INH IN AEPB: 1.0000 | INHALATION_SPRAY | Freq: Every day | RESPIRATORY_TRACT | 0 refills | Status: DC

## 2019-03-24 MED ORDER — BREO ELLIPTA 200-25 MCG/INH IN AEPB: 1.0000 | INHALATION_SPRAY | Freq: Every day | RESPIRATORY_TRACT | 3 refills | Status: DC

## 2019-03-24 NOTE — Progress Notes (Signed)
Subjective:    Patient ID: Kimberly Lynch, female    DOB: 11-Aug-1985, 33 y.o.   MRN: [id number]  Patient was recently told about having COPD Follow-up for obstructive lung disease  Continues to complain of shortness of breath Multiple agents make her short of breath-dust, perfume, smell of bleed, alcohol  Was recently evaluated for shortness of shortness of breath, cough, decreased secretions, chest discomfort/chest pain  Continues to smoke actively, states she has cut down  Smokes weed  Use of illicit substances in the past including cocaine, marijuana-claims she is no longer using any obvious  Shortness of breath with all activities Does occasionally get short of breath at rest Still has a cough, occasional yellow secretions but usually clear Continues to have what she describes as lung pains She does have some joint aches and pains Chronic back pain  Scoliosis of the spine She is exposed to a lot of secondhand smoke from family and other members of her household  No family history of lung disease known to her-she is adopted    Past Medical History:  Diagnosis Date  . Anxiety   . Arthritis   . Bipolar 1 disorder ([place])   . COPD (chronic obstructive pulmonary disease) ([place])   . Depression   . Headache 04/20/2015  . Near syncope 02/03/2015  . Panic   . Scoliosis    Social History   Socioeconomic History  . Marital status: Married    Spouse name: Not on file  . Number of children: 3  . Years of education: 9th  . Highest education level: Not on file  Occupational History  . 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 Smoking status: Current Every Day Smoker    Packs/day: 0.25    Years: 22.00    Pack years: 5.50    Types: Cigarettes  . Smokeless tobacco: Never Used  . Tobacco comment: Down to 4-5 daily  Substance and Sexual Activity  . Alcohol use: No    Alcohol/week: 0.0 standard drinks    Comment: States has quit completely  . Drug use: Yes     Types: Marijuana    Comment: States used marijuana   . Sexual activity: Yes    Partners: Male    Birth control/protection: None  Other Topics Concern  . Not on file  Social History Narrative   Patient lives at home with family.   Caffeine Use: 3 16oz sodas daily   Social Determinants of Health   Financial Resource Strain:   . Difficulty of Paying Living Expenses: Not on file  Food Insecurity:   . Worried About Charity fundraiser in the Last Year: Not on file  . Ran Out of Food in the Last Year: Not on file  Transportation Needs:   . Lack of Transportation (Medical): Not on file  . Lack of Transportation (Non-Medical): Not on file  Physical Activity:   . Days of Exercise per Week: Not on file  . Minutes of Exercise per Session: Not on file  Stress:   . Feeling of Stress : Not on file  Social Connections:   . Frequency of Communication with Friends and Family: Not on file  . Frequency of Social Gatherings with Friends and Family: Not on file  . Attends Religious Services: Not on file  . Active Member of Clubs or Organizations: Not on file  . Attends Archivist Meetings: Not on file  . Marital Status: Not on file  Intimate Partner Violence:   . Fear of Current or Ex-Partner: Not on file  . Emotionally Abused: Not on file  . Physically Abused: Not on file  . Sexually Abused: Not on file   Family History  Adopted: Yes  Problem Relation Age of Onset  . Cancer Other   . Diabetes Other    Review of Systems  Constitutional: Negative for fever and unexpected weight change.  HENT: Positive for sinus pressure. Negative for congestion, dental problem, ear pain, nosebleeds, postnasal drip, rhinorrhea, sneezing, sore throat and trouble swallowing.   Eyes: Negative for redness and itching.  Respiratory: Positive for cough, chest tightness and shortness of breath. Negative for choking and wheezing.   Cardiovascular: Negative for palpitations and leg swelling.    Gastrointestinal: Negative for nausea and vomiting.  Genitourinary: Negative for dysuria.  Musculoskeletal: Negative for joint swelling.  Skin: Negative for rash.  Allergic/Immunologic: Positive for environmental allergies and food allergies. Negative for immunocompromised state.  Neurological: Negative for headaches.  Hematological: Does not bruise/bleed easily.  Psychiatric/Behavioral: Negative for dysphoric mood. The patient is nervous/anxious.       Objective:   Physical Exam HENT:     Head: Normocephalic.     Nose: Nose normal. No congestion.     Mouth/Throat:     Mouth: Mucous membranes are moist.     Comments: Dental caries Eyes:     General:        Right eye: No discharge.        Left eye: No discharge.     Pupils: Pupils are equal, round, and reactive to light.  Cardiovascular:     Rate and Rhythm: Normal rate and regular rhythm.     Pulses: Normal pulses.     Heart sounds: No murmur.  Pulmonary:     Effort: Pulmonary effort is normal. No respiratory distress.     Breath sounds: Normal breath sounds. No stridor. No wheezing, rhonchi or rales.  Musculoskeletal:     Cervical back: Normal range of motion. No rigidity.  Neurological:     General: No focal deficit present.     Mental Status: She is alert.  Psychiatric:     Comments: Anxious    Vitals:   03/24/19 1408  BP: 108/60  Pulse: (!) 106  Temp: 98.4 F (36.9 C)  SpO2: 97%       PFT reviewed does appear suboptimal with coughing and inability to exhale following use of albuterol There was flattening of the expiratory curve following use of albuterol   Assessment & Plan:  Chronic obstructive pulmonary disease -Continues to smoke actively -Encouraged to quit smoking -Regular exercise encouraged  Restrictive lung disease -She does have severe scoliosis  Shortness of breath -Related to obstructive lung disease -Encouraged to avoid known triggers  .  Active smoker -Importance of quitting smoking  discussed with patient -The risk of progression of disease discussed with patient  .  Bipolar disorder   Testing have been negative so far, CT scan significant for small nodules No evidence of emphysema   Plan:  Discontinue Spiriva  We will trial with Breo 200  We will see her back in 3 months  I will request another provider to evaluate patient as well  She does have multiple symptoms that may be secondary to a bipolar disorder, multiple complaints, CT not consistent with obstructive disease, PFT is suboptimal  I will get a copy of a previous PFT from her previous provider Greig Castilla [phone] Follow-up in 3 months

## 2019-03-24 NOTE — Progress Notes (Signed)
Full PFT performed today. °

## 2019-03-24 NOTE — Patient Instructions (Signed)
Trial with Breo  Discontinue Spiriva  We will follow-up in about 3 months

## 2019-03-31 ENCOUNTER — Telehealth: Payer: Self-pay | Admitting: Pulmonary Disease

## 2019-03-31 NOTE — Telephone Encounter (Signed)
Patient called requesting PFT results.  Patient completed PFT 03/24/19 before OV and stated Dr. Wynona Neat was going to contact her to compare PFT to current PFT. No PFT results from outside facility received at this time.  Will follow up if and when PFT results received.

## 2019-03-31 NOTE — Telephone Encounter (Signed)
That is fine to restart Spiriva and remain off BREO .  Will send to Dr. Melida Quitter and make sure he does not want her to return sooner for follow up

## 2019-03-31 NOTE — Telephone Encounter (Signed)
Patient returned call.  Patient stated she was currently  on Breo, but it has made it harder to breathe. Patient is no longer taking Breo.  Patient stated she would like to return to Spiriva or try another inhaler.  Patient stated Spiriva worked for her in the past and would need a new prescription if recommended to return to it.  Per Dr Wynona Neat 03/24/19  Instructions    Return in about 3 months (around 06/22/2019). Trial with Breo  Discontinue Spiriva  We will follow-up in about 3 months     Message routed to [facility], NP to advise on inhalers

## 2019-03-31 NOTE — Telephone Encounter (Signed)
ATC patient x's 2. Phone rings 1 time and goes to static. No VM available to leave message.

## 2019-03-31 NOTE — Telephone Encounter (Signed)
LMTCB

## 2019-04-01 NOTE — Telephone Encounter (Signed)
ATC pt, no answer. Left message for pt to call back.  

## 2019-04-01 NOTE — Telephone Encounter (Signed)
Patient had PFT at [facility] 03/24/19 and saw Dr. Wynona Neat afterwards.  There are no other PFT's results in epic to compare it to.  I have not received any PFT's from outside facility, but I was not with Dr. Wynona Neat on this day, so I will follow up with him when he returns to office, to find out if patient signed medical release or what may be needed.

## 2019-04-01 NOTE — Telephone Encounter (Signed)
Kimberly Lynch please advise if we need to request outside PFT's or if this has already been done.  Thanks!

## 2019-04-01 NOTE — Telephone Encounter (Signed)
Pt states she returns call & to PLEASE leave a detailed message on her VM when returning call.

## 2019-04-02 NOTE — Telephone Encounter (Signed)
Per Erica,medical release was faxed requesting PFT from [facility].   At this time fax has not been received.  ATC medical office [phone], and was placed on hold for medical records. Will follow up 04/03/2019.

## 2019-04-02 NOTE — Telephone Encounter (Signed)
lmtcb X3 for pt.  Will close encounter per triage protocol.  

## 2019-04-02 NOTE — Telephone Encounter (Signed)
Looked in patient's chart, it looks like she had a PFT done at [facility] last summer around 10/31/18.   Left message for patient to call back.

## 2019-04-02 NOTE — Telephone Encounter (Signed)
[phone] pt calling back new phone number

## 2019-04-03 NOTE — Telephone Encounter (Signed)
Called and spoke with Patient.  Informed Patient that we are still waiting on PFT results from [facility] and we will reach out to her once Dr.Olalere has compared it to most recent PFT.  Patient stated understanding. Called [facility] [phone] medical records.  Left detailed message on VM, of PFT request we're waiting on. Will continue to follow up.

## 2019-04-03 NOTE — Telephone Encounter (Signed)
Called [facility] medical records, was placed on hold and was on hold x10 minutes so hung up. Will try to call back later.

## 2019-04-03 NOTE — Telephone Encounter (Signed)
Patient calling back advised trying to get records from [facility] - she would like a call back on status after trying again with [facility] - she can be reached at [phone]

## 2019-04-07 NOTE — Telephone Encounter (Signed)
Misty Stanley, did you ever receive the PFT? Thanks

## 2019-04-11 IMAGING — CT CT ABD-PELV W/ CM
2 of 4 series · 15 of 46 positions shown, 17 images · IV contrast (iopamidol)
Comparison: 03/26/2017

CLINICAL DATA: Three-week history of intermittent abdominal pain.

EXAM:
CT ABDOMEN AND PELVIS WITH CONTRAST
TECHNIQUE: Multidetector CT imaging of the abdomen and pelvis was performed
using the standard protocol following bolus administration of
intravenous contrast.
CONTRAST:  100mL 483PRS-QCC IOPAMIDOL (483PRS-QCC) INJECTION 61%

[Series 2: axial st · axial · 0.65mm/px · z∈[-618,-233]mm · 12 of 89 slices shown, 14 images]
[im 8/89  soft-tissue]
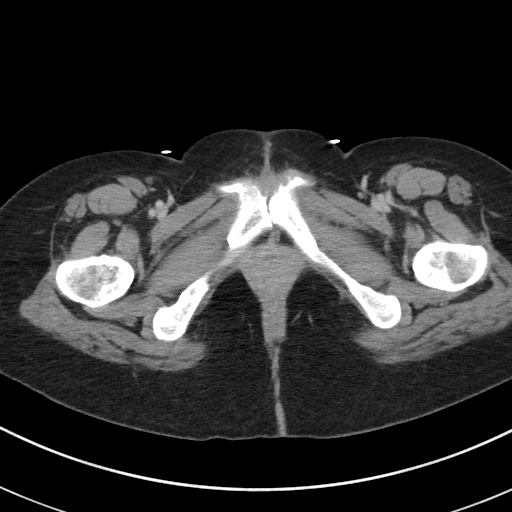
[im 8/89  bone]
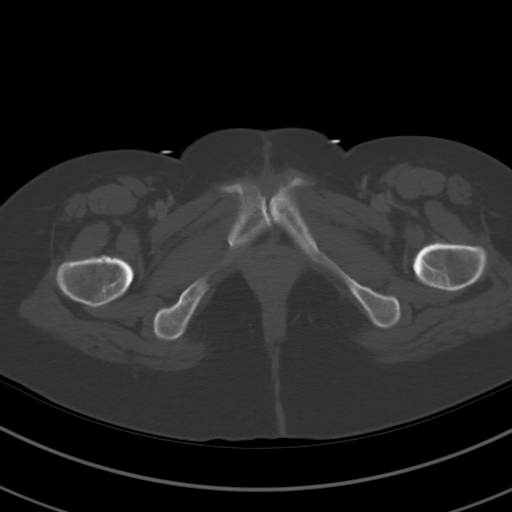
[im 15/89  soft-tissue]
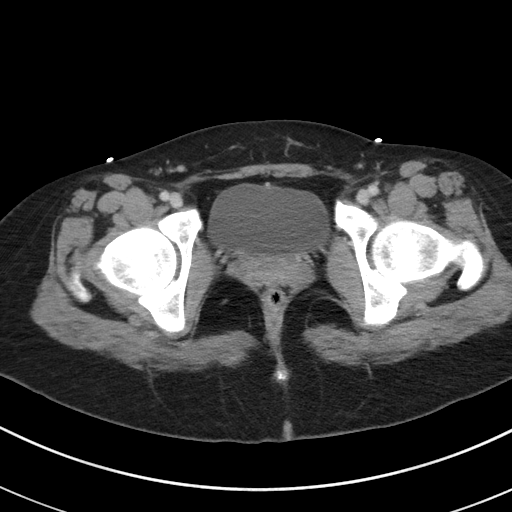
[im 22/89  soft-tissue]
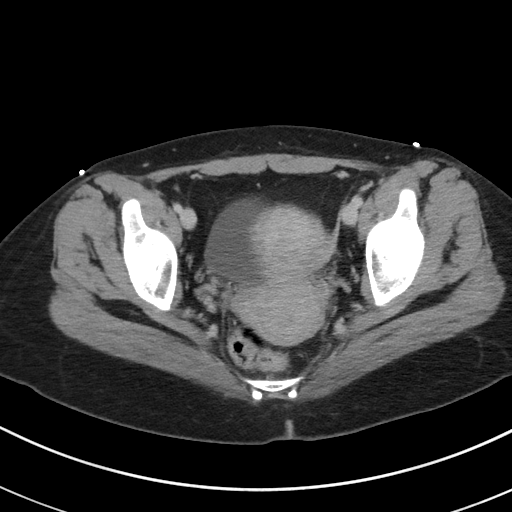
[im 29/89  soft-tissue]
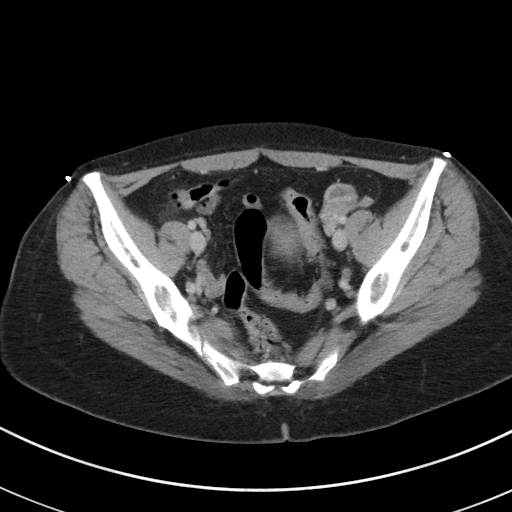
[im 36/89  soft-tissue]
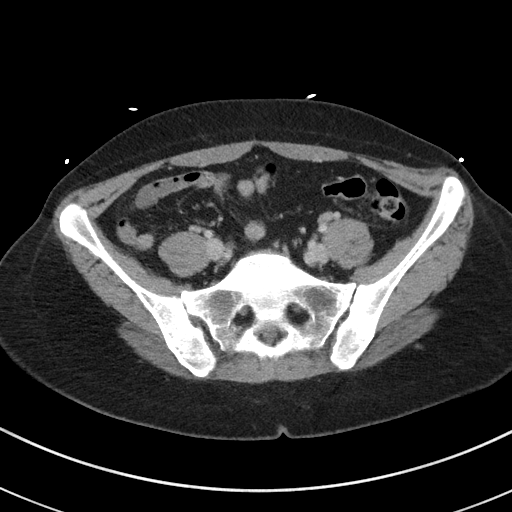
[im 43/89  soft-tissue]
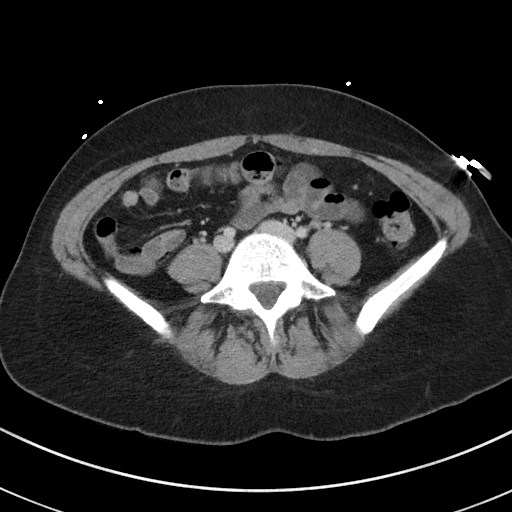
[im 50/89  soft-tissue]
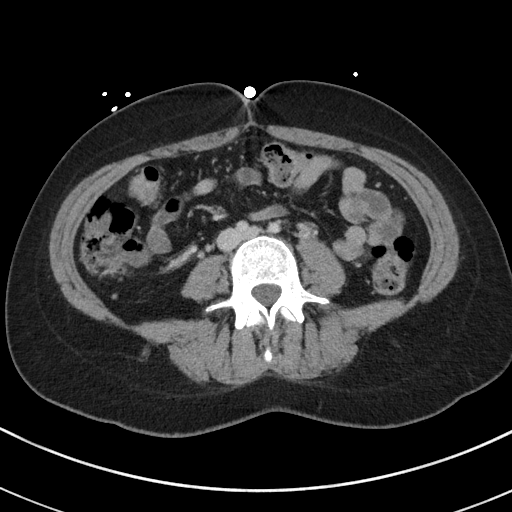
[im 57/89  soft-tissue]
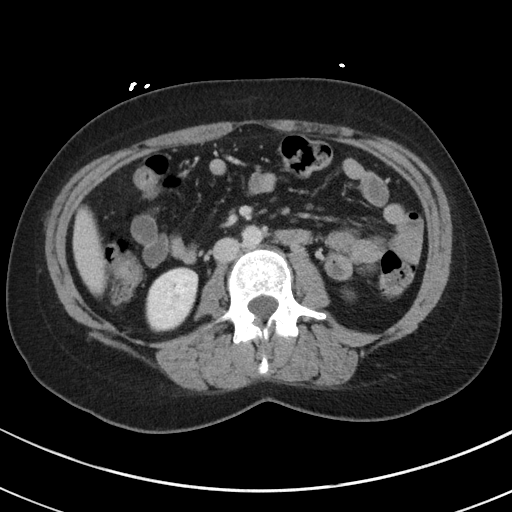
[im 64/89  soft-tissue]
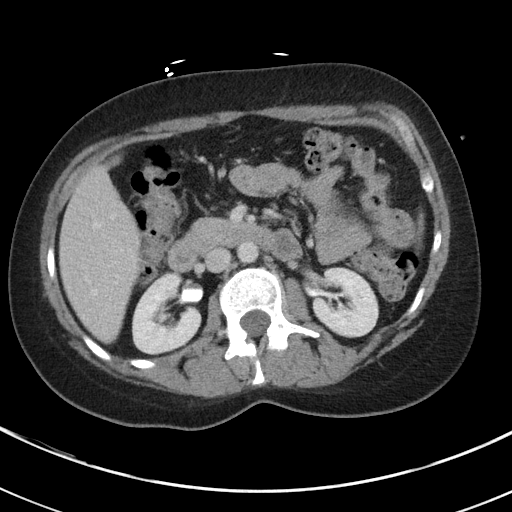
[im 64/89  bone]
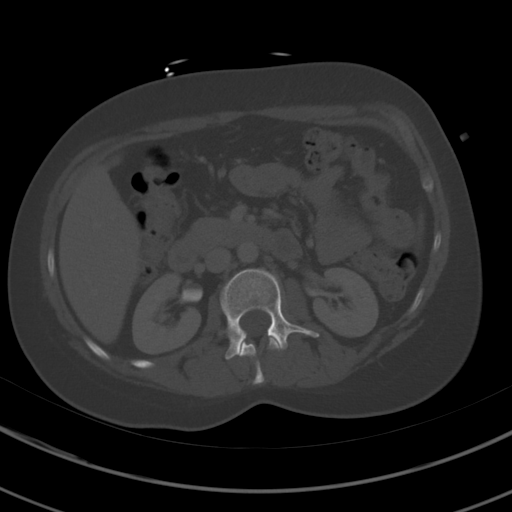
[im 71/89  soft-tissue]
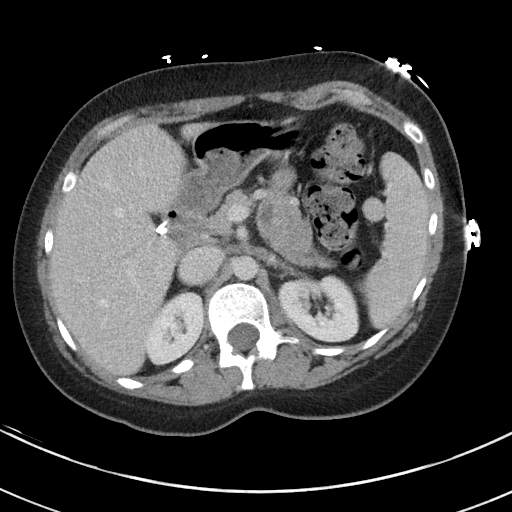
[im 78/89  soft-tissue]
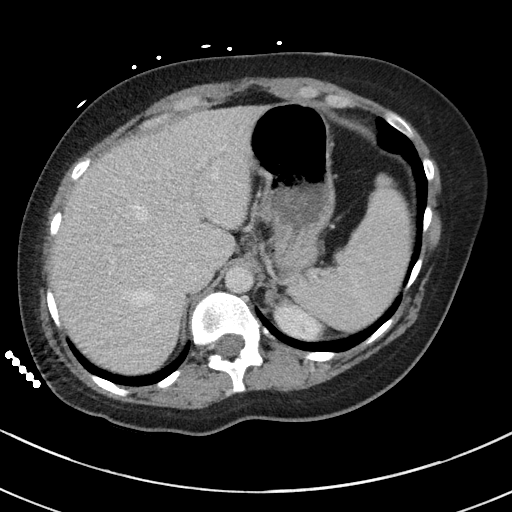
[im 85/89  soft-tissue]
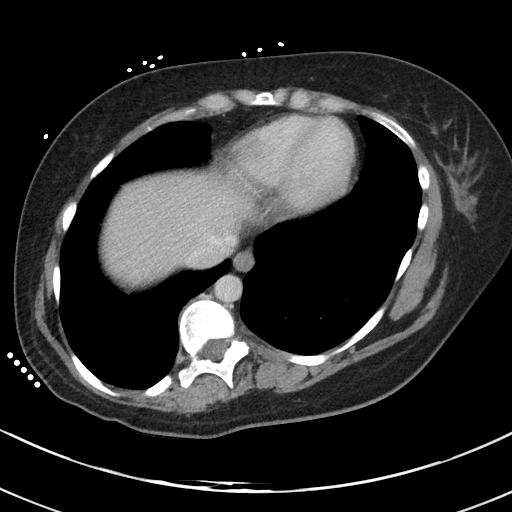

[Series 5: coronal st · coronal · 0.64mm/px · 3 of 101 slices shown]
[im 34/101  soft-tissue]
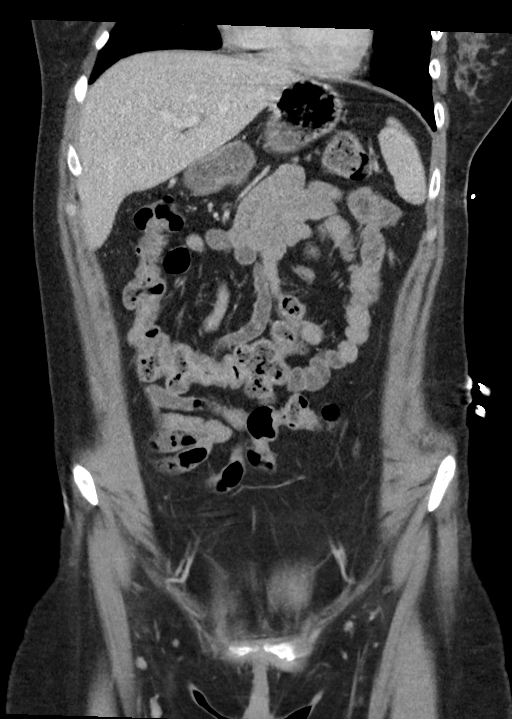
[im 45/101  soft-tissue]
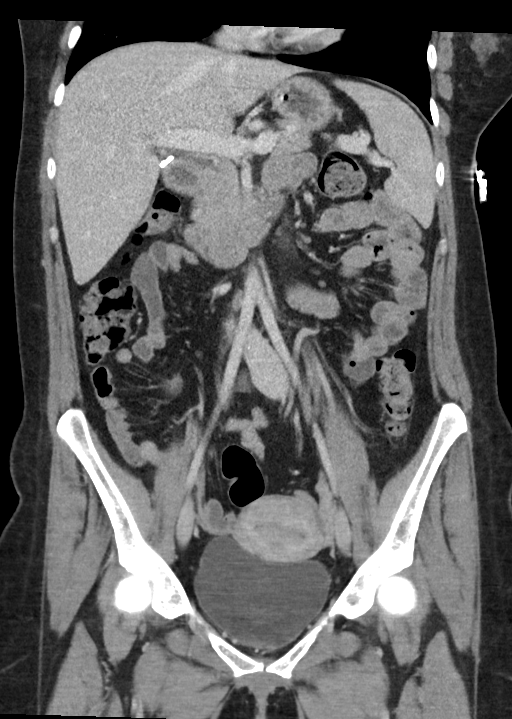
[im 56/101  soft-tissue]
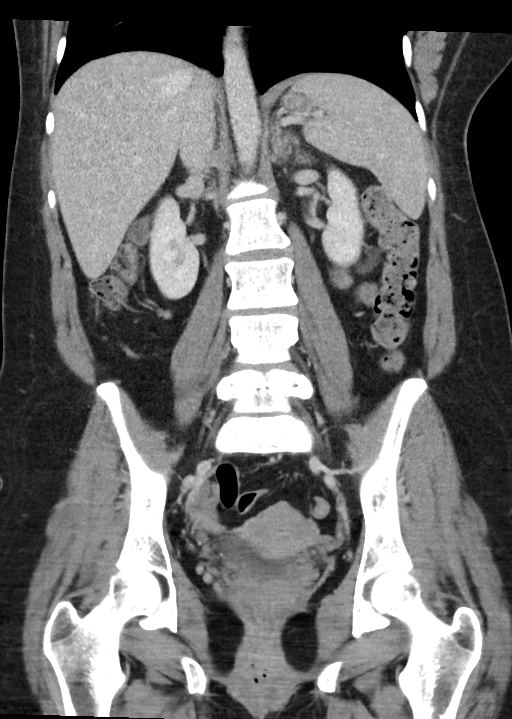

[15 of 46 positions shown; findings below may reference images not displayed]

FINDINGS: Lower chest: Unremarkable.

Hepatobiliary: No suspicious focal abnormality within the liver
parenchyma. Gallbladder surgically absent. No intrahepatic or
extrahepatic biliary dilation.

Pancreas: No focal mass lesion. No dilatation of the main duct. No
intraparenchymal cyst. No peripancreatic edema.

Spleen: No splenomegaly. No focal mass lesion.

Adrenals/Urinary Tract: No adrenal nodule or mass. Probable tiny
bilateral nonobstructing renal stones although assessment limited by
early excretion of intravenous contrast material. No evidence for
hydroureter. The urinary bladder appears normal for the degree of
distention.

Stomach/Bowel: Stomach is unremarkable. No gastric wall thickening.
No evidence of outlet obstruction. Duodenum is normally positioned
as is the ligament of Treitz. No small bowel wall thickening. No
small bowel dilatation. The terminal ileum is normal. The appendix
is normal. No gross colonic mass. No colonic wall thickening.

Vascular/Lymphatic: No abdominal aortic aneurysm. No abdominal
aortic atherosclerotic calcification. There is no gastrohepatic or
hepatoduodenal ligament lymphadenopathy. No intraperitoneal or
retroperitoneal lymphadenopathy. No pelvic sidewall lymphadenopathy.

Reproductive: Probable uterine fibroids.  There is no adnexal mass.

Other: No intraperitoneal free fluid.

Musculoskeletal: No worrisome lytic or sclerotic osseous
abnormality.
IMPRESSION: 1. No acute findings in the abdomen or pelvis. Specifically, no
findings to explain the patient's history of pain.
2. Probable tiny bilateral nonobstructing renal stones although
assessment limited by early excretion of intravenous contrast.
Patient was noted to have tiny nonobstructing stones in both kidneys
on the prior study.

## 2019-04-21 NOTE — Telephone Encounter (Signed)
I have left several messages at [facility] medical records for PFT, including today.   I have not received any PFT results from them.  I have left another message requesting PFT results.

## 2019-04-25 NOTE — Telephone Encounter (Signed)
ATC [facility] at [facility].  Left detailed message on Jennifer's VM,  for PFT results or a call back about Patient's PFT.

## 2019-05-01 NOTE — Telephone Encounter (Signed)
I have after several attempts been unsuccessful at receiving PFT from [facility] pulmonary office in [facility].   Will close message.

## 2019-09-10 ENCOUNTER — Encounter: Payer: Self-pay | Admitting: Cardiology

## 2019-09-10 ENCOUNTER — Other Ambulatory Visit: Payer: Self-pay

## 2019-09-10 ENCOUNTER — Ambulatory Visit: Payer: Medicare HMO | Admitting: Cardiology

## 2019-09-10 ENCOUNTER — Ambulatory Visit: Payer: Medicare HMO

## 2019-09-10 VITALS — BP 114/83 | HR 106 | Ht 64.0 in | Wt 136.0 lb

## 2019-09-10 DIAGNOSIS — J449 Chronic obstructive pulmonary disease, unspecified: Secondary | ICD-10-CM

## 2019-09-10 DIAGNOSIS — R072 Precordial pain: Secondary | ICD-10-CM

## 2019-09-10 DIAGNOSIS — R000 Tachycardia, unspecified: Secondary | ICD-10-CM

## 2019-09-10 DIAGNOSIS — R0602 Shortness of breath: Secondary | ICD-10-CM

## 2019-09-10 MED ORDER — METOPROLOL TARTRATE 25 MG PO TABS: 12.5000 mg | ORAL_TABLET | Freq: Two times a day (BID) | ORAL | 0 refills | Status: DC
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
## 2019-09-11 ENCOUNTER — Telehealth: Payer: Self-pay

## 2019-09-11 LAB — BASIC METABOLIC PANEL
BUN/Creatinine Ratio: 15 (ref 9–23)
BUN: 9 mg/dL (ref 6–20)
CO2: 21 mmol/L (ref 20–29)
Calcium: 9.4 mg/dL (ref 8.7–10.2)
Chloride: 108 mmol/L — ABNORMAL HIGH (ref 96–106)
Creatinine, Ser: 0.59 mg/dL (ref 0.57–1.00)
GFR calc Af Amer: 138 mL/min/{1.73_m2} (ref >59)
GFR calc non Af Amer: 120 mL/min/{1.73_m2} (ref >59)
Glucose: 91 mg/dL (ref 65–99)
Potassium: 4.2 mmol/L (ref 3.5–5.2)
Sodium: 144 mmol/L (ref 134–144)

## 2019-09-11 LAB — D-DIMER, QUANTITATIVE: D-DIMER: 0.43 mg/L FEU (ref 0.00–0.49)

## 2019-09-11 LAB — HCG, SERUM, QUALITATIVE: hCG,Beta Subunit,Qual,Serum: NEGATIVE m[IU]/mL (ref <6)

## 2019-09-11 LAB — BRAIN NATRIURETIC PEPTIDE: BNP: 6.6 pg/mL (ref 0.0–100.0)

## 2019-09-11 LAB — TSH: TSH: 1.07 u[IU]/mL (ref 0.450–4.500)

## 2019-09-11 NOTE — Telephone Encounter (Signed)
Gave patient results she verbalized understanding.

## 2019-09-11 NOTE — Telephone Encounter (Signed)
-----   Message from [place], [place] sent at 09/11/2019  9:36 AM EDT ----- D-Dimer within normal limits.  Proceed with coronary calcification CT scan.

## 2019-09-17 ENCOUNTER — Other Ambulatory Visit: Payer: Self-pay

## 2019-09-17 ENCOUNTER — Ambulatory Visit: Payer: Medicare HMO

## 2019-09-17 DIAGNOSIS — R000 Tachycardia, unspecified: Secondary | ICD-10-CM

## 2019-10-07 ENCOUNTER — Telehealth: Payer: Self-pay

## 2019-10-07 NOTE — Telephone Encounter (Signed)
Please tell her will review at the next office visit.

## 2019-10-07 NOTE — Telephone Encounter (Signed)
Pt calling for results of her CT scan?

## 2019-10-12 NOTE — Progress Notes (Signed)
Coronary artery calcification scoring performed on 10/03/2019 at [facility]: Total calcium score: 0 AU Non-cardiac finding: right convex thoracic scoliosis.

## 2019-10-22 ENCOUNTER — Ambulatory Visit: Payer: Medicare HMO | Admitting: Cardiology

## 2019-10-28 ENCOUNTER — Ambulatory Visit: Payer: Medicare HMO | Admitting: Cardiology

## 2019-10-28 ENCOUNTER — Encounter: Payer: Self-pay | Admitting: Cardiology

## 2019-10-28 ENCOUNTER — Other Ambulatory Visit: Payer: Self-pay

## 2019-10-28 VITALS — BP 105/78 | HR 89 | Resp 17 | Ht 64.0 in | Wt 126.0 lb

## 2019-10-28 DIAGNOSIS — Z712 Person consulting for explanation of examination or test findings: Secondary | ICD-10-CM

## 2019-10-28 DIAGNOSIS — R072 Precordial pain: Secondary | ICD-10-CM

## 2019-10-28 DIAGNOSIS — J449 Chronic obstructive pulmonary disease, unspecified: Secondary | ICD-10-CM

## 2019-10-28 DIAGNOSIS — R000 Tachycardia, unspecified: Secondary | ICD-10-CM
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
## 2019-10-29 ENCOUNTER — Other Ambulatory Visit: Payer: Self-pay

## 2019-10-29 DIAGNOSIS — R072 Precordial pain: Secondary | ICD-10-CM

## 2019-10-31 ENCOUNTER — Telehealth: Payer: Self-pay | Admitting: Pulmonary Disease

## 2019-10-31 NOTE — Telephone Encounter (Signed)
error 

## 2020-01-23 ENCOUNTER — Telehealth: Payer: Self-pay

## 2020-01-23 NOTE — Telephone Encounter (Signed)
Patient called she is experiencing irregular heartbeat either her heartbeat is very elevated or too low patient's oxygen is reading low as well patient would like to know what is going on and what can she do regarding her symptoms. We aren't able to contact patient she doesn't have a personal phone she will call again later around 3:30 please advise.

## 2020-01-24 NOTE — Telephone Encounter (Signed)
Were you able to speak to her on Friday 01/23/2020 as per the recommendations provided.

## 2020-01-26 NOTE — Telephone Encounter (Signed)
Yes I spoke to patient and told her what you recommended. Patient also has an appt this upcoming Friday and I told her to call back today to let us know how she is doing.

## 2020-01-30 ENCOUNTER — Ambulatory Visit: Payer: Medicare HMO | Admitting: Cardiology

## 2020-07-01 ENCOUNTER — Ambulatory Visit: Payer: Medicare HMO | Admitting: Cardiology

## 2020-07-01 ENCOUNTER — Other Ambulatory Visit: Payer: Self-pay

## 2020-07-01 ENCOUNTER — Encounter: Payer: Self-pay | Admitting: Cardiology

## 2020-07-01 VITALS — BP 124/84 | HR 68 | Temp 98.6°F | Resp 16 | Ht 64.0 in | Wt 150.0 lb

## 2020-07-01 DIAGNOSIS — R0602 Shortness of breath: Secondary | ICD-10-CM

## 2020-07-01 DIAGNOSIS — R002 Palpitations: Secondary | ICD-10-CM

## 2020-07-01 DIAGNOSIS — J449 Chronic obstructive pulmonary disease, unspecified: Secondary | ICD-10-CM
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
## 2020-12-31 ENCOUNTER — Other Ambulatory Visit: Payer: Self-pay

## 2020-12-31 ENCOUNTER — Encounter: Payer: Self-pay | Admitting: Cardiology

## 2020-12-31 ENCOUNTER — Ambulatory Visit: Payer: Medicare HMO | Admitting: Cardiology

## 2020-12-31 VITALS — BP 107/77 | HR 89 | Temp 97.3°F | Ht 64.0 in | Wt 149.0 lb

## 2020-12-31 DIAGNOSIS — R0602 Shortness of breath: Secondary | ICD-10-CM

## 2020-12-31 DIAGNOSIS — R002 Palpitations: Secondary | ICD-10-CM
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
## 2021-01-13 ENCOUNTER — Emergency Department (HOSPITAL_COMMUNITY)
Admission: EM | Admit: 2021-01-13 | Discharge: 2021-01-14 | Disposition: A | Discharge status: Home / Self Care | Payer: Medicare HMO | Attending: Emergency Medicine | Admitting: Emergency Medicine

## 2021-01-13 DIAGNOSIS — J449 Chronic obstructive pulmonary disease, unspecified: Secondary | ICD-10-CM | POA: Diagnosis not present

## 2021-01-13 DIAGNOSIS — R053 Chronic cough: Secondary | ICD-10-CM

## 2021-01-13 DIAGNOSIS — R531 Weakness: Secondary | ICD-10-CM | POA: Insufficient documentation

## 2021-01-13 DIAGNOSIS — Z20822 Contact with and (suspected) exposure to covid-19: Secondary | ICD-10-CM | POA: Diagnosis not present

## 2021-01-13 DIAGNOSIS — R059 Cough, unspecified: Secondary | ICD-10-CM | POA: Diagnosis present

## 2021-01-13 DIAGNOSIS — Z87891 Personal history of nicotine dependence: Secondary | ICD-10-CM | POA: Diagnosis not present

## 2021-01-13 NOTE — ED Provider Notes (Signed)
Emergency Medicine Provider Triage Evaluation Note  Kimberly Lynch , a 35 y.o. female  was evaluated in triage.  Pt complains of worsening of breath and cough over the last week.  She has a history of cystic fibrosis.  She was seen by pulmonology yesterday and called in a course of doxycycline.  However, she reports that previously she has not improved with doxycycline.  She reached out to her pulmonology team and was advised to come to the emergency department for further work-up and evaluation of her symptoms.  She states  "it feels as if I am suffocating" She denies chest pain, fever, vomiting, abdominal pain, rash.  Review of Systems  Positive: Shortness of breath, cough Negative: Fever, vomiting, abdominal pain, rash  Physical Exam  BP 123/87 (BP Location: Right Arm)   Pulse (!) 105   Temp 98.9 F (37.2 C) (Oral)   Resp 20   SpO2 98%  Gen:   Awake, no distress   Resp:  Normal effort, lung sounds are diminished throughout.  No adventitious breath sounds MSK:   Moves extremities without difficulty  Other:  Anxious appearing  Medical Decision Making  Medically screening exam initiated at 11:57 PM.  Appropriate orders placed.  AZAYA GOEDDE was informed that the remainder of the evaluation will be completed by another provider, this initial triage assessment does not replace that evaluation, and the importance of remaining in the ED until their evaluation is complete.  Labs and imaging have been ordered.  She will require further work-up and evaluation in the emergency department.   Barkley Boards, PA-C 01/14/21 Lambert Keto, MD 01/15/21 [phone]

## 2021-01-13 NOTE — ED Triage Notes (Signed)
Pt c/o a cough and weakness for a while. Pt contacted her doctor who recommended she come to the ED to receive some antibiotics.

## 2021-01-14 ENCOUNTER — Emergency Department (HOSPITAL_COMMUNITY): Payer: Medicare HMO

## 2021-01-14 ENCOUNTER — Other Ambulatory Visit: Payer: Self-pay

## 2021-01-14 LAB — CBC WITH DIFFERENTIAL/PLATELET
Abs Immature Granulocytes: 0.03 10*3/uL (ref 0.00–0.07)
Basophils Absolute: 0.1 10*3/uL (ref 0.0–0.1)
Basophils Relative: 1 %
Eosinophils Absolute: 0.1 10*3/uL (ref 0.0–0.5)
Eosinophils Relative: 1 %
HCT: 43.1 % (ref 36.0–46.0)
Hemoglobin: 13.9 g/dL (ref 12.0–15.0)
Immature Granulocytes: 0 %
Lymphocytes Relative: 27 %
Lymphs Abs: 3.1 10*3/uL (ref 0.7–4.0)
MCH: 29.8 pg (ref 26.0–34.0)
MCHC: 32.3 g/dL (ref 30.0–36.0)
MCV: 92.3 fL (ref 80.0–100.0)
Monocytes Absolute: 0.7 10*3/uL (ref 0.1–1.0)
Monocytes Relative: 6 %
Neutro Abs: 7.6 10*3/uL (ref 1.7–7.7)
Neutrophils Relative %: 65 %
Platelets: 366 10*3/uL (ref 150–400)
RBC: 4.67 MIL/uL (ref 3.87–5.11)
RDW: 11.9 % (ref 11.5–15.5)
WBC: 11.6 10*3/uL — ABNORMAL HIGH (ref 4.0–10.5)
nRBC: 0 % (ref 0.0–0.2)

## 2021-01-14 LAB — BASIC METABOLIC PANEL
Anion gap: 6 (ref 5–15)
BUN: 10 mg/dL (ref 6–20)
CO2: 25 mmol/L (ref 22–32)
Calcium: 9.3 mg/dL (ref 8.9–10.3)
Chloride: 106 mmol/L (ref 98–111)
Creatinine, Ser: 0.67 mg/dL (ref 0.44–1.00)
GFR, Estimated: >60 mL/min (ref >60)
Glucose, Bld: 93 mg/dL (ref 70–99)
Potassium: 3.7 mmol/L (ref 3.5–5.1)
Sodium: 137 mmol/L (ref 135–145)

## 2021-01-14 LAB — RESP PANEL BY RT-PCR (FLU A&B, COVID) ARPGX2
Influenza A by PCR: NEGATIVE
Influenza B by PCR: NEGATIVE
SARS Coronavirus 2 by RT PCR: NEGATIVE

## 2021-01-14 LAB — I-STAT BETA HCG BLOOD, ED (MC, WL, AP ONLY): I-stat hCG, quantitative: <5 m[IU]/mL (ref <5)

## 2021-01-14 MED ORDER — IPRATROPIUM-ALBUTEROL 0.5-2.5 (3) MG/3ML IN SOLN: 3.0000 mL | Freq: Once | RESPIRATORY_TRACT | Status: DC

## 2021-01-14 MED ORDER — DOXYCYCLINE HYCLATE 100 MG PO TABS
100.0000 mg | ORAL_TABLET | Freq: Once | ORAL | Status: AC
  Administered 2021-01-14: 100 mg via ORAL
  Filled 2021-01-14: qty 1
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
## 2021-01-14 NOTE — Discharge Instructions (Signed)
You were seen in the emergency room today with cough and weakness.  I would like for you to fill the antibiotics prescribed by your pulmonologist yesterday and begin taking this.  We have given your first dose here in the emergency department.  Please continue your pulmonary physical therapy vest at home along with inhalers.  Return with any fever or sudden worsening symptoms.  Please keep your pulmonology team updated at [facility] by phone. They may want to see you in the office sooner than your currently scheduled appointment.

## 2021-04-28 ENCOUNTER — Telehealth: Payer: Self-pay

## 2021-04-28 NOTE — Telephone Encounter (Signed)
Pt called and wants to be seen for bp 105/91 dizziness, heart rate 111

## 2021-04-28 NOTE — Telephone Encounter (Signed)
Please follow up with PCP.  If possible with [place] at the next available.   ST

## 2021-04-28 NOTE — Telephone Encounter (Signed)
I spoke to patient she said she started feeling chest pain and dizziness yesterday night and today checked her bp which was 105/91 patient said it has been a while she has gotten test done and wanted to ask you If she can get a CT done. Patient was told to reach out to PCP as well your next opening is 2/30 patient was transferred to front desk to get scheduled please advise

## 2021-04-28 NOTE — Telephone Encounter (Signed)
If she has chest pain please have her go to ED for evaluation.   Dr. Odis Hollingshead

## 2021-04-29 NOTE — Telephone Encounter (Signed)
Patient is aware 

## 2021-05-16 ENCOUNTER — Ambulatory Visit: Payer: Medicare HMO | Admitting: Cardiology

## 2021-09-23 ENCOUNTER — Telehealth: Payer: Self-pay

## 2021-09-23 NOTE — Telephone Encounter (Signed)
Please have her reach out to her PCP for evaluation.  Encourage her to keep herself well hydrated, change position slowly.  IF her symptoms she can go to urgent care for further evaluation.   Dr. Odis Hollingshead

## 2021-09-23 NOTE — Telephone Encounter (Signed)
She has an echo and calcium score in 2021.  Please have her go to ER or urgent care to address her current symptoms.  She can make a follow up appt next week.   Dr. Odis Hollingshead

## 2021-09-23 NOTE — Telephone Encounter (Signed)
Patient stated her heart is "pounding really hard" and she is getting arm pain and it has been 3 years since she has had an "x-ray of her heart or any scan" she would like to get one done please advise

## 2021-09-23 NOTE — Telephone Encounter (Signed)
Made patient aware she voiced understanding patient was advised to call PCP in the first phone encounter she stated she called them and they told her the same thing to seek medical attention to the closest ED

## 2023-08-08 DIAGNOSIS — I6389 Other cerebral infarction: Secondary | ICD-10-CM | POA: Diagnosis not present
